# Patient Record
Sex: Male | Born: 1970 | Race: White | Hispanic: No | Smoking: Never smoker
Health system: Southern US, Community
[De-identification: ages and names within clinical notes are randomized; demographics above are authoritative.]

## PROBLEM LIST (undated history)

## (undated) DIAGNOSIS — F32A Depression, unspecified: Secondary | ICD-10-CM

## (undated) DIAGNOSIS — F419 Anxiety disorder, unspecified: Secondary | ICD-10-CM

## (undated) DIAGNOSIS — K589 Irritable bowel syndrome without diarrhea: Secondary | ICD-10-CM

## (undated) DIAGNOSIS — G4730 Sleep apnea, unspecified: Secondary | ICD-10-CM

## (undated) DIAGNOSIS — F329 Major depressive disorder, single episode, unspecified: Secondary | ICD-10-CM

## (undated) HISTORY — DX: Irritable bowel syndrome, unspecified: K58.9

## (undated) HISTORY — DX: Anxiety disorder, unspecified: F41.9

## (undated) HISTORY — DX: Major depressive disorder, single episode, unspecified: F32.9

## (undated) HISTORY — DX: Depression, unspecified: F32.A

---

## 2009-05-17 ENCOUNTER — Emergency Department (HOSPITAL_COMMUNITY): Admission: EM | Admit: 2009-05-17 | Discharge: 2009-05-18 | Payer: Self-pay | Admitting: Emergency Medicine

## 2010-04-18 IMAGING — CT CT ABDOMEN W/O CM
2 of 4 series · 17 of 46 positions shown, 19 images · non-contrast
Comparison: None

CT ABDOMEN

CLINICAL DATA: Right lower quadrant pain, nausea, vomiting,
diarrhea, history year rule bowel syndrome

CT ABDOMEN AND PELVIS WITHOUT CONTRAST
TECHNIQUE: Multidetector CT imaging of the abdomen and pelvis was
performed following the standard protocol without intravenous
contrast. IV contrast not administered due to creatinine of 1.8.
The patient received dilute oral contrast for study. Breast shield
utilized.  Sagittal and coronal MPR images reconstructed from
axial data set.

[Series 2: rtn ap without · axial · non-contrast · 0.64mm/px · z∈[+1202,+1608]mm · 14 of 89 slices shown, 16 images]
[im 4/89  soft-tissue]
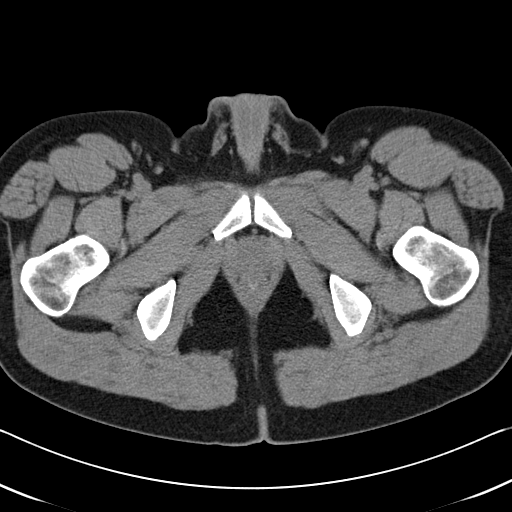
[im 4/89  bone]
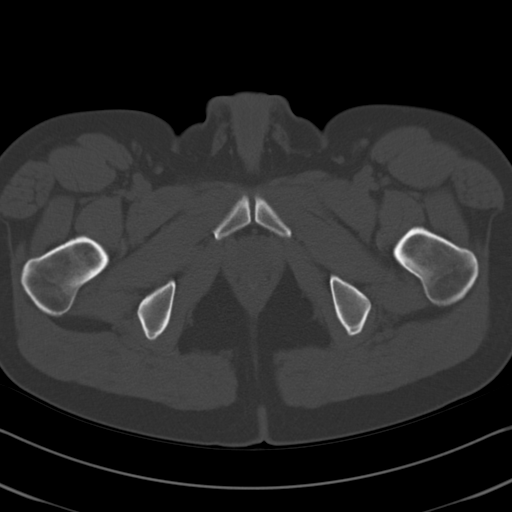
[im 11/89  soft-tissue]
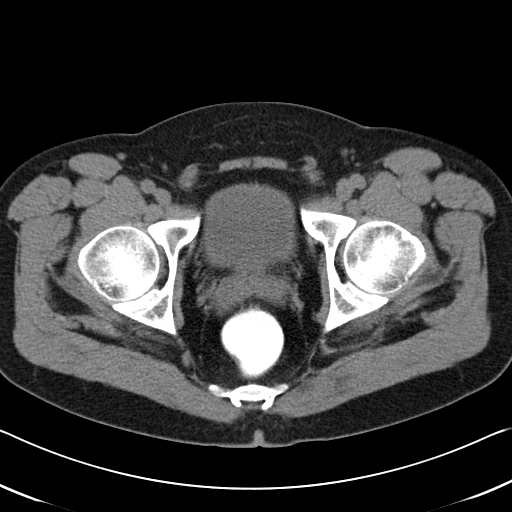
[im 18/89  soft-tissue]
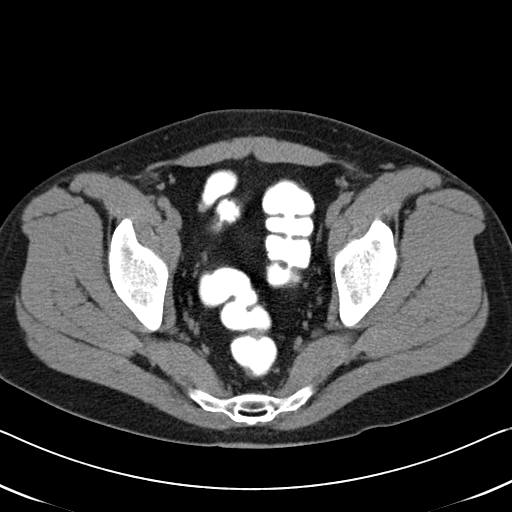
[im 25/89  soft-tissue]
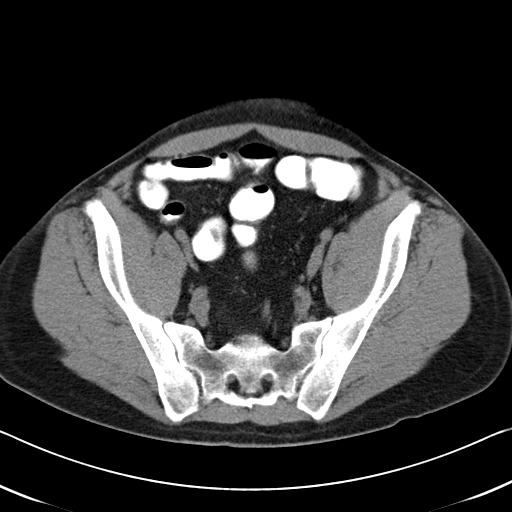
[im 29/89  soft-tissue]
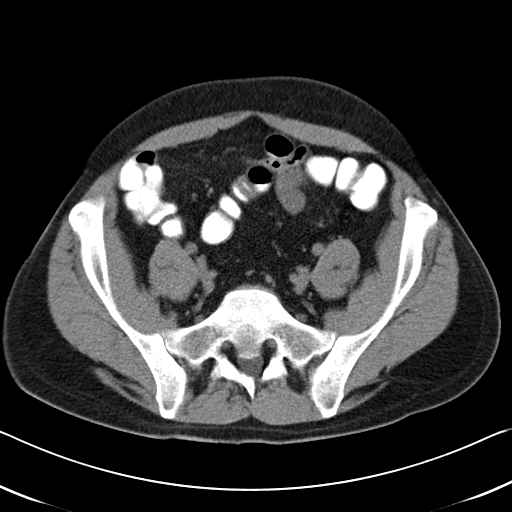
[im 36/89  soft-tissue]
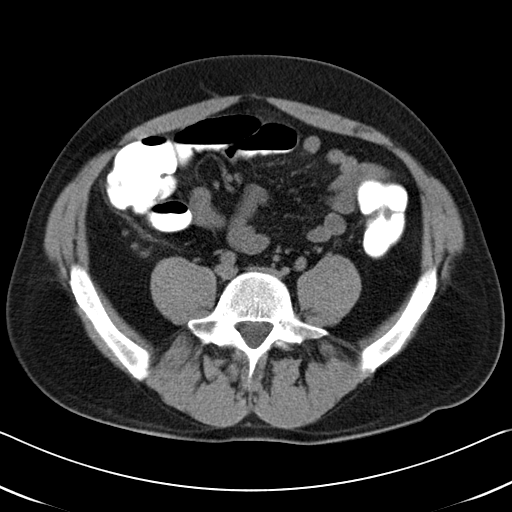
[im 43/89  soft-tissue]
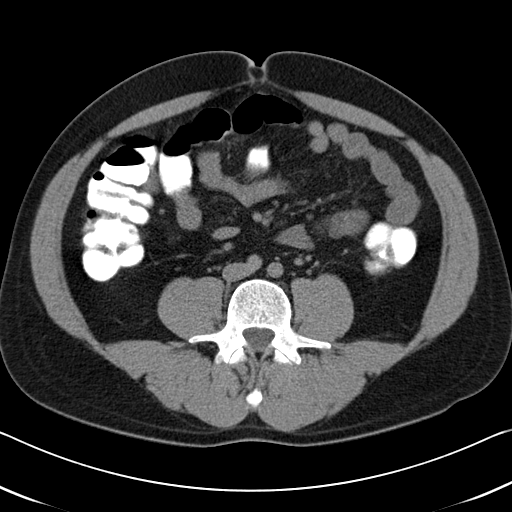
[im 46/89  soft-tissue]
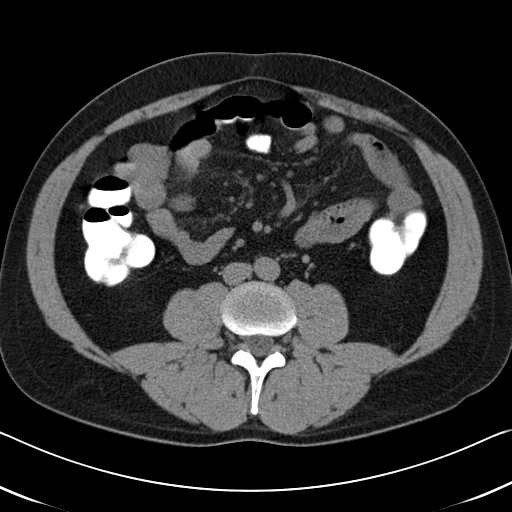
[im 53/89  soft-tissue]
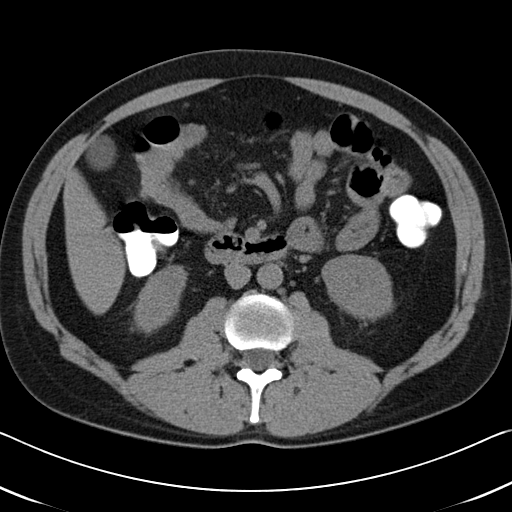
[im 53/89  bone]
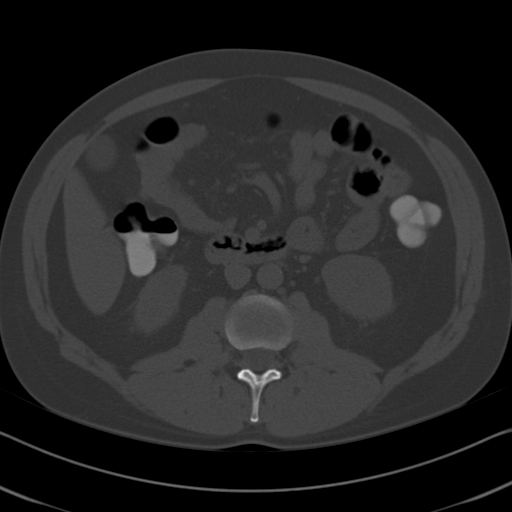
[im 60/89  soft-tissue]
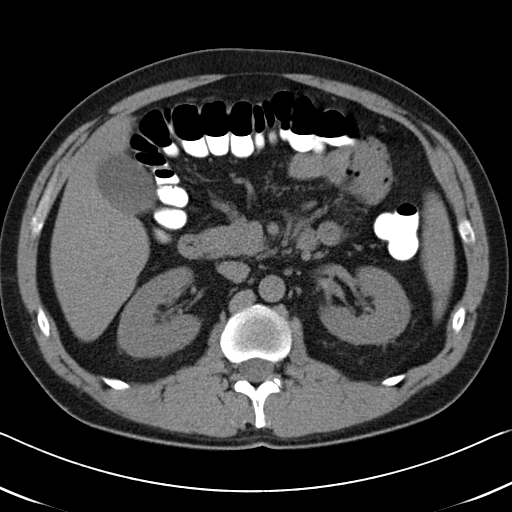
[im 67/89  soft-tissue]
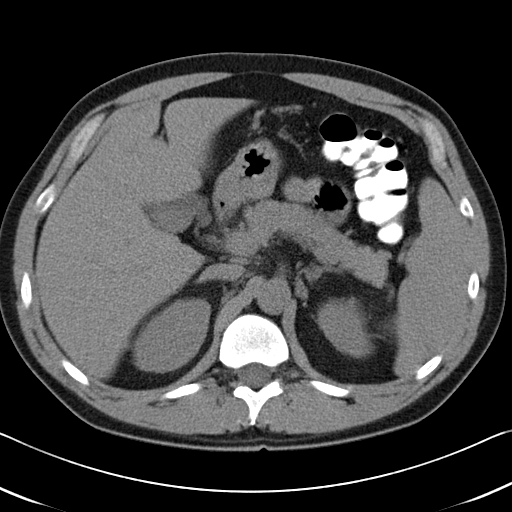
[im 71/89  soft-tissue]
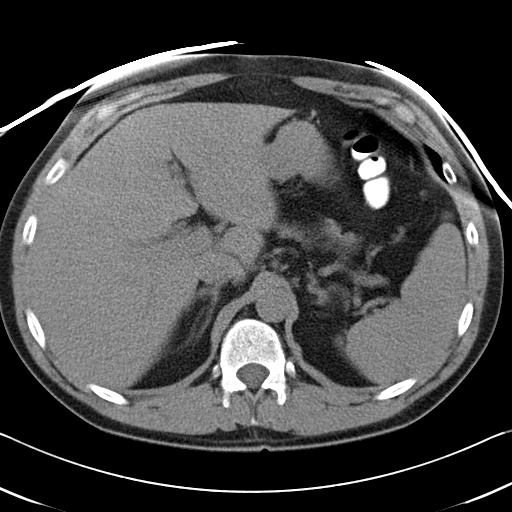
[im 78/89  soft-tissue]
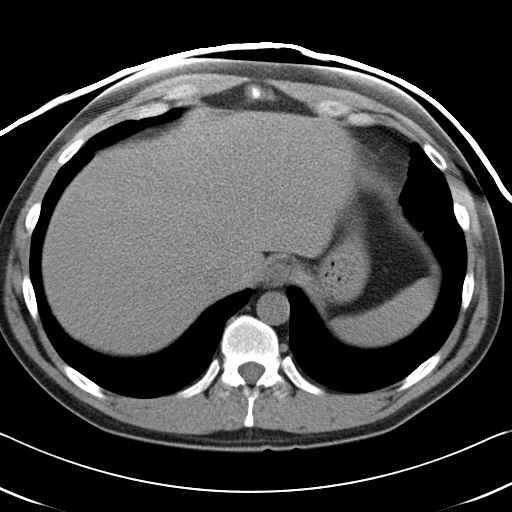
[im 85/89  soft-tissue]
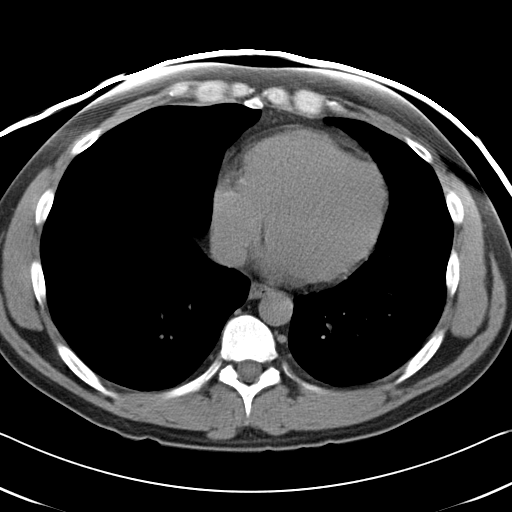

[Series 602: <mpr thick range> · coronal · 0.90mm/px · 3 of 79 slices shown]
[im 27/79  soft-tissue]
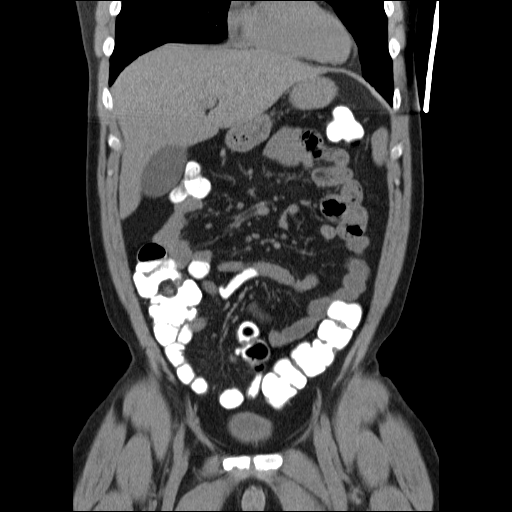
[im 35/79  soft-tissue]
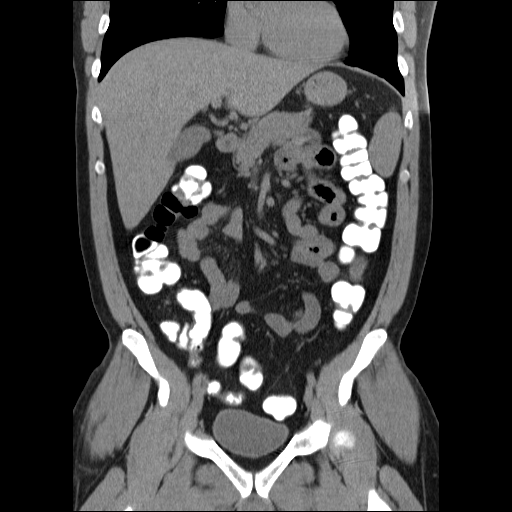
[im 44/79  soft-tissue]
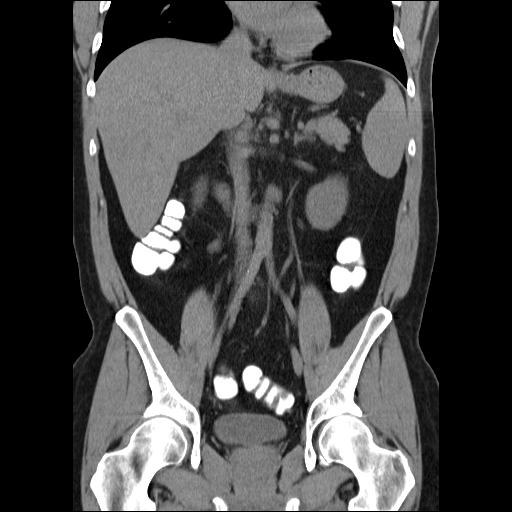

[17 of 46 positions shown; findings below may reference images not displayed]

FINDINGS: Lung bases clear.
Within limits of a nonenhanced exam, no focal abnormalities of the
liver, spleen, pancreas, kidneys, or adrenal glands.
Stomach and upper abdominal bowel loops unremarkable.
No mass, adenopathy, free fluid, or inflammatory process.
IMPRESSION: No acute upper abdominal abnormalities.

CT PELVIS
FINDINGS: Normal appendix.
Large and small bowel loops in pelvis normal.
Contrast present to rectum.
No pelvic mass, adenopathy, free fluid, or inflammatory process.
Bladder unremarkable.
No focal bony abnormalities.
IMPRESSION: No acute intrapelvic abnormalities.

## 2011-04-03 LAB — CBC
HCT: 51.7 % (ref 39.0–52.0)
Platelets: 148 10*3/uL — ABNORMAL LOW (ref 150–400)
RDW: 12.1 % (ref 11.5–15.5)

## 2011-04-03 LAB — URINE MICROSCOPIC-ADD ON

## 2011-04-03 LAB — COMPREHENSIVE METABOLIC PANEL
AST: 100 U/L — ABNORMAL HIGH (ref 0–37)
Albumin: 3.9 g/dL (ref 3.5–5.2)
Alkaline Phosphatase: 63 U/L (ref 39–117)
BUN: 13 mg/dL (ref 6–23)
GFR calc Af Amer: 52 mL/min — ABNORMAL LOW (ref >60)
Potassium: 3.6 mEq/L (ref 3.5–5.1)
Total Protein: 6.6 g/dL (ref 6.0–8.3)

## 2011-04-03 LAB — URINALYSIS, ROUTINE W REFLEX MICROSCOPIC
Glucose, UA: NEGATIVE mg/dL
Hgb urine dipstick: NEGATIVE
Nitrite: NEGATIVE
Specific Gravity, Urine: 1.037 — ABNORMAL HIGH (ref 1.005–1.030)
pH: 5.5 (ref 5.0–8.0)

## 2011-04-03 LAB — DIFFERENTIAL
Basophils Relative: 0 % (ref 0–1)
Lymphocytes Relative: 1 % — ABNORMAL LOW (ref 12–46)
Monocytes Absolute: 0.6 10*3/uL (ref 0.1–1.0)
Monocytes Relative: 6 % (ref 3–12)
Neutro Abs: 9.3 10*3/uL — ABNORMAL HIGH (ref 1.7–7.7)

## 2014-03-19 ENCOUNTER — Encounter: Payer: Self-pay | Admitting: Gastroenterology

## 2014-05-11 ENCOUNTER — Encounter: Payer: Self-pay | Admitting: Gastroenterology

## 2014-05-11 ENCOUNTER — Other Ambulatory Visit: Payer: 59

## 2014-05-11 ENCOUNTER — Ambulatory Visit (INDEPENDENT_AMBULATORY_CARE_PROVIDER_SITE_OTHER): Payer: 59 | Admitting: Gastroenterology

## 2014-05-11 VITALS — BP 100/70 | HR 72 | Ht 69.0 in | Wt 152.0 lb

## 2014-05-11 DIAGNOSIS — R197 Diarrhea, unspecified: Secondary | ICD-10-CM

## 2014-05-11 DIAGNOSIS — K529 Noninfective gastroenteritis and colitis, unspecified: Secondary | ICD-10-CM

## 2014-05-11 NOTE — Patient Instructions (Addendum)
You will have labs checked today in the basement lab.  Please head down after you check out with the front desk  (celiac panel). If the celiac panel is not helpful then further lab tests and likely colonoscopy.

## 2014-05-11 NOTE — Progress Notes (Signed)
HPI: This is a   very pleasant 43 year old man of [place], [place] heritage.  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 He's was told this many years ago.  Loose stools for 20 years.  Imodium will improve his stools.  Still will have urgency.  Has never seen blood.  Has never really had specific testing for this.  Will have to take 4 imodium at night.  Never tested for celiac sprue.  Decaf coffee daily  Drinks 0-2 beers per day.   Review of systems: Pertinent positive and negative review of systems were noted in the above HPI section. Complete review of systems was performed and was otherwise normal.    Past Medical History  Diagnosis Date  . Anxiety   . IBS (irritable bowel syndrome)   . Depression     History reviewed. No pertinent past surgical history.  No current outpatient prescriptions on file.   No current facility-administered medications for this visit.    Allergies as of 05/11/2014 - Review Complete 05/11/2014  Allergen Reaction Noted  . Codeine  05/11/2014    Family History  Problem Relation Age of Onset  . Diabetes Mother     uncle  . Heart disease Mother     father  . Stomach cancer Mother     History   Social History  . Marital Status: Married    Spouse Name: N/A    Number of Children: N/A  . Years of Education: N/A   Occupational History  . Not on file.   Social History Main Topics  . Smoking status: Former Games developermoker  . Smokeless tobacco: Not on file  . Alcohol Use: Yes     Comment: 0-3 per day  . Drug Use: No  . Sexual Activity: Not on file   Other Topics Concern  . Not on file   Social History Narrative  . No narrative on file       Physical Exam: Ht 5\' 9"  (1.753 m)  Wt 152 lb (68.947 kg)  BMI 22.44 kg/m2 Constitutional: generally well-appearing Psychiatric: alert and oriented x3 Eyes: extraocular movements intact Mouth: oral pharynx moist, no lesions Neck: supple no  lymphadenopathy Cardiovascular: heart regular rate and rhythm Lungs: clear to auscultation bilaterally Abdomen: soft, nontender, nondistended, no obvious ascites, no peritoneal signs, normal bowel sounds Extremities: no lower extremity edema bilaterally Skin: no lesions on visible extremities    Assessment and plan: 43 y.o. male with chronic diarrhea, [place], [place] heritage  I think there is a very good chance that he has a celiac sprue. He will have a celiac sprue panel performed today and if it is positive I will discuss with him having an upper endoscopy to obtain biopsy proof. If the celiac panel is negative then he would likely require further lab tests and a colonoscopy to rule out other potential causes of his chronic diarrhea.

## 2014-05-12 LAB — CELIAC PANEL 10
Endomysial Screen: NEGATIVE
GLIADIN IGG: 15.2 U/mL (ref <20)
Gliadin IgA: 9.2 U/mL (ref <20)
IGA: 247 mg/dL (ref 68–379)
Tissue Transglut Ab: 5.5 U/mL (ref <20)
Tissue Transglutaminase Ab, IgA: 3.3 U/mL (ref <20)

## 2014-05-14 ENCOUNTER — Telehealth: Payer: Self-pay | Admitting: Gastroenterology

## 2014-05-14 NOTE — Telephone Encounter (Signed)
Pt aware that the labs have not been reviewed I will call as soon as available

## 2014-05-19 ENCOUNTER — Encounter: Payer: Self-pay | Admitting: Gastroenterology

## 2014-05-19 ENCOUNTER — Other Ambulatory Visit: Payer: Self-pay

## 2014-05-19 DIAGNOSIS — R197 Diarrhea, unspecified: Secondary | ICD-10-CM

## 2014-09-30 ENCOUNTER — Encounter: Payer: Self-pay | Admitting: Gastroenterology

## 2014-10-18 ENCOUNTER — Other Ambulatory Visit: Payer: 59

## 2014-10-18 DIAGNOSIS — R197 Diarrhea, unspecified: Secondary | ICD-10-CM

## 2014-10-19 LAB — GASTROINTESTINAL PATHOGEN PANEL PCR
C. DIFFICILE TOX A/B, PCR: NEGATIVE
CAMPYLOBACTER, PCR: NEGATIVE
Cryptosporidium, PCR: NEGATIVE
E COLI (ETEC) LT/ST, PCR: NEGATIVE
E COLI (STEC) STX1/STX2, PCR: NEGATIVE
E COLI 0157, PCR: NEGATIVE
Giardia lamblia, PCR: NEGATIVE
Norovirus, PCR: NEGATIVE
Rotavirus A, PCR: NEGATIVE
SALMONELLA, PCR: NEGATIVE
Shigella, PCR: NEGATIVE

## 2014-11-12 ENCOUNTER — Ambulatory Visit (AMBULATORY_SURGERY_CENTER): Payer: Self-pay | Admitting: *Deleted

## 2014-11-12 VITALS — Ht 69.0 in | Wt 156.0 lb

## 2014-11-12 DIAGNOSIS — R197 Diarrhea, unspecified: Secondary | ICD-10-CM

## 2014-11-12 MED ORDER — MOVIPREP 100 G PO SOLR: 1.0000 | Freq: Once | ORAL | Status: DC

## 2014-11-12 NOTE — Progress Notes (Signed)
No egg or soy allergy. No anesthesia problems.  No diet meds.  No Home O2.

## 2014-12-03 ENCOUNTER — Ambulatory Visit (AMBULATORY_SURGERY_CENTER): Payer: 59 | Admitting: Gastroenterology

## 2014-12-03 ENCOUNTER — Encounter: Payer: Self-pay | Admitting: Gastroenterology

## 2014-12-03 VITALS — BP 122/62 | HR 82 | Temp 98.9°F | Resp 24 | Ht 69.0 in | Wt 156.0 lb

## 2014-12-03 DIAGNOSIS — R197 Diarrhea, unspecified: Secondary | ICD-10-CM

## 2014-12-03 MED ORDER — SODIUM CHLORIDE 0.9 % IV SOLN
500.0000 mL | INTRAVENOUS | Status: DC
Start: 2014-12-03 — End: 2014-12-03

## 2014-12-03 NOTE — Patient Instructions (Signed)
YOU HAD AN ENDOSCOPIC PROCEDURE TODAY AT THE [facility]: Refer to the procedure report that was given to you for any specific questions about what was found during the examination.  If the procedure report does not answer your questions, please call your gastroenterologist to clarify.  If you requested that your care partner not be given the details of your procedure findings, then the procedure report has been included in a sealed envelope for you to review at your convenience later.  YOU SHOULD EXPECT: Some feelings of bloating in the abdomen. Passage of more gas than usual.  Walking can help get rid of the air that was put into your GI tract during the procedure and reduce the bloating. If you had a lower endoscopy (such as a colonoscopy or flexible sigmoidoscopy) you may notice spotting of blood in your stool or on the toilet paper. If you underwent a bowel prep for your procedure, then you may not have a normal bowel movement for a few days.  DIET: Your first meal following the procedure should be a light meal and then it is ok to progress to your normal diet.  A half-sandwich or bowl of soup is an example of a good first meal.  Heavy or fried foods are harder to digest and may make you feel nauseous or bloated.  Likewise meals heavy in dairy and vegetables can cause extra gas to form and this can also increase the bloating.  Drink plenty of fluids but you should avoid alcoholic beverages for 24 hours.  ACTIVITY: Your care partner should take you home directly after the procedure.  You should plan to take it easy, moving slowly for the rest of the day.  You can resume normal activity the day after the procedure however you should NOT DRIVE or use heavy machinery for 24 hours (because of the sedation medicines used during the test).    SYMPTOMS TO REPORT IMMEDIATELY: A gastroenterologist can be reached at any hour.  During normal business hours, 8:30 AM to 5:00 PM Monday through Friday,  call [phone].  After hours and on weekends, please call the GI answering service at [phone] who will take a message and have the physician on call contact you.   Following lower endoscopy (colonoscopy or flexible sigmoidoscopy):  Excessive amounts of blood in the stool  Significant tenderness or worsening of abdominal pains  Swelling of the abdomen that is new, acute  Fever of 100F or higher    FOLLOW UP: If any biopsies were taken you will be contacted by phone or by letter within the next 1-3 weeks.  Call your gastroenterologist if you have not heard about the biopsies in 3 weeks.  Our staff will call the home number listed on your records the next business day following your procedure to check on you and address any questions or concerns that you may have at that time regarding the information given to you following your procedure. This is a courtesy call and so if there is no answer at the home number and we have not heard from you through the emergency physician on call, we will assume that you have returned to your regular daily activities without incident.  SIGNATURES/CONFIDENTIALITY: You and/or your care partner have signed paperwork which will be entered into your electronic medical record.  These signatures attest to the fact that that the information above on your After Visit Summary has been reviewed and is understood.  Full responsibility of the confidentiality   of this discharge information lies with you and/or your care-partner.   Information on diverticulosis given to you today   

## 2014-12-03 NOTE — Progress Notes (Signed)
Report to PACU, RN, vss, BBS= Clear.  

## 2014-12-03 NOTE — Progress Notes (Signed)
Called to room to assist during endoscopic procedure.  Patient ID and intended procedure confirmed with present staff. Received instructions for my participation in the procedure from the performing physician.  

## 2014-12-03 NOTE — Op Note (Signed)
[facility] [address] [place], [id number]   COLONOSCOPY PROCEDURE REPORT  PATIENT: Gregory Vaughan, Gregory Vaughan  MR#: [id number] BIRTHDATE: February 03, 1971 , 43  yrs. old GENDER: male ENDOSCOPIST: Rachael Feeaniel P Jacobs, MD PROCEDURE DATE:  12/03/2014 PROCEDURE:   Colonoscopy with biopsy First Screening Colonoscopy - Avg.  risk and is 50 yrs.  old or older - No.  Prior Negative Screening - Now for repeat screening. N/A  History of Adenoma - Now for follow-up colonoscopy & has been > or = to 3 yrs.  N/A  Polyps Removed Today? No.  Recommend repeat exam, <10 yrs? No. ASA CLASS:   Class II INDICATIONS:chronic loose stools.  GI pathogen panel neg, celiac panel neg. MEDICATIONS: Monitored anesthesia care and Propofol 260 mg IV  DESCRIPTION OF PROCEDURE:   After the risks benefits and alternatives of the procedure were thoroughly explained, informed consent was obtained.  The digital rectal exam revealed no abnormalities of the rectum.   The LB PFC-[id number]  endoscope was introduced through the anus and advanced to the terminal ileum which was intubated for a short distance. No adverse events experienced.   The quality of the prep was excellent.  The instrument was then slowly withdrawn as the colon was fully examined.   COLON FINDINGS: The terminal ileum was normal.  There were a few scattered left sided diverticulum.  The colonic mucosa was normal, biopsied and sent to pathology.  Retroflexed views revealed no abnormalities. The time to cecum=3 minutes 51 seconds.  Withdrawal time=6 minutes 32 seconds.  The scope was withdrawn and the procedure completed. COMPLICATIONS: There were no immediate complications.  ENDOSCOPIC IMPRESSION: The terminal ileum was normal.  There were a few scattered left sided diverticulum.  The colonic mucosa was normal, biopsied and sent to pathology  RECOMMENDATIONS: Await biopsy results For now, continue imodium as needed.  eSigned:  Rachael Feeaniel P  Jacobs, MD 12/03/2014 9:23 AM

## 2014-12-06 ENCOUNTER — Telehealth: Payer: Self-pay | Admitting: *Deleted

## 2014-12-06 NOTE — Telephone Encounter (Signed)
  Follow up Call-  Call back number 12/03/2014  Post procedure Call Back phone  # [phone]  Permission to leave phone message Yes     Patient questions:  Do you have a fever, pain , or abdominal swelling? Yes.   Pain Score  0 *  Have you tolerated food without any problems? Yes.    Have you been able to return to your normal activities? Yes.    Do you have any questions about your discharge instructions: Diet   No. Medications  No. Follow up visit  No.  Do you have questions or concerns about your Care? No.  Actions: * If pain score is 4 or above: No action needed, pain <4. Patient stating he has a 100.2 temp. This am and some chest congestion. Patient stating his preschooler is also sick. Denies and abdominal pain, swelling or bleeding. Also no complaints of nausea, vomiting or diarrhea.

## 2015-12-01 ENCOUNTER — Ambulatory Visit (INDEPENDENT_AMBULATORY_CARE_PROVIDER_SITE_OTHER): Payer: 59 | Admitting: Internal Medicine

## 2015-12-01 ENCOUNTER — Encounter: Payer: Self-pay | Admitting: Internal Medicine

## 2015-12-01 VITALS — BP 120/80 | HR 56 | Temp 98.4°F | Resp 16 | Ht 69.0 in | Wt 156.4 lb

## 2015-12-01 DIAGNOSIS — K529 Noninfective gastroenteritis and colitis, unspecified: Secondary | ICD-10-CM | POA: Diagnosis not present

## 2015-12-01 DIAGNOSIS — Z23 Encounter for immunization: Secondary | ICD-10-CM

## 2015-12-01 DIAGNOSIS — F411 Generalized anxiety disorder: Secondary | ICD-10-CM | POA: Diagnosis not present

## 2015-12-01 MED ORDER — DULOXETINE HCL 30 MG PO CPEP: 30.0000 mg | ORAL_CAPSULE | Freq: Every day | ORAL | Status: DC

## 2015-12-01 MED ORDER — ELUXADOLINE 100 MG PO TABS: 100.0000 mg | ORAL_TABLET | Freq: Two times a day (BID) | ORAL | Status: DC

## 2015-12-01 NOTE — Patient Instructions (Signed)
We have sent in the medicine for the anxiety called duloxetine (cymbalta) that you take once a day. Give it 2-3 weeks to get to full benefit. If it is helping some but not enough we can increase you to the full dose (from the starting dose).   We have given you the medicine called viberzi for the diarrhea. It is a twice daily medicine that can take up to 2 weeks to get to full benefit. If you are not helped in 2 weeks then it may not be right for you.   Stress and Stress Management Stress is a normal reaction to life events. It is what you feel when life demands more than you are used to or more than you can handle. Some stress can be useful. For example, the stress reaction can help you catch the last bus of the day, study for a test, or meet a deadline at work. But stress that occurs too often or for too long can cause problems. It can affect your emotional health and interfere with relationships and normal daily activities. Too much stress can weaken your immune system and increase your risk for physical illness. If you already have a medical problem, stress can make it worse. CAUSES  All sorts of life events may cause stress. An event that causes stress for one person may not be stressful for another person. Major life events commonly cause stress. These may be positive or negative. Examples include losing your job, moving into a new home, getting married, having a baby, or losing a loved one. Less obvious life events may also cause stress, especially if they occur day after day or in combination. Examples include working long hours, driving in traffic, caring for children, being in debt, or being in a difficult relationship. SIGNS AND SYMPTOMS Stress may cause emotional symptoms including, the following:  Anxiety. This is feeling worried, afraid, on edge, overwhelmed, or out of control.  Anger. This is feeling irritated or impatient.  Depression. This is feeling sad, down, helpless, or  guilty.  Difficulty focusing, remembering, or making decisions. Stress may cause physical symptoms, including the following:   Aches and pains. These may affect your head, neck, back, stomach, or other areas of your body.  Tight muscles or clenched jaw.  Low energy or trouble sleeping. Stress may cause unhealthy behaviors, including the following:   Eating to feel better (overeating) or skipping meals.  Sleeping too little, too much, or both.  Working too much or putting off tasks (procrastination).  Smoking, drinking alcohol, or using drugs to feel better. DIAGNOSIS  Stress is diagnosed through an assessment by your health care provider. Your health care provider will ask questions about your symptoms and any stressful life events.Your health care provider will also ask about your medical history and may order blood tests or other tests. Certain medical conditions and medicine can cause physical symptoms similar to stress. Mental illness can cause emotional symptoms and unhealthy behaviors similar to stress. Your health care provider may refer you to a mental health professional for further evaluation.  TREATMENT  Stress management is the recommended treatment for stress.The goals of stress management are reducing stressful life events and coping with stress in healthy ways.  Techniques for reducing stressful life events include the following:  Stress identification. Self-monitor for stress and identify what causes stress for you. These skills may help you to avoid some stressful events.  Time management. Set your priorities, keep a calendar of events, and learn  to say "no." These tools can help you avoid making too many commitments. Techniques for coping with stress include the following:  Rethinking the problem. Try to think realistically about stressful events rather than ignoring them or overreacting. Try to find the positives in a stressful situation rather than focusing on  the negatives.  Exercise. Physical exercise can release both physical and emotional tension. The key is to find a form of exercise you enjoy and do it regularly.  Relaxation techniques. These relax the body and mind. Examples include yoga, meditation, tai chi, biofeedback, deep breathing, progressive muscle relaxation, listening to music, being out in nature, journaling, and other hobbies. Again, the key is to find one or more that you enjoy and can do regularly.  Healthy lifestyle. Eat a balanced diet, get plenty of sleep, and do not smoke. Avoid using alcohol or drugs to relax.  Strong support network. Spend time with family, friends, or other people you enjoy being around.Express your feelings and talk things over with someone you trust. Counseling or talktherapy with a mental health professional may be helpful if you are having difficulty managing stress on your own. Medicine is typically not recommended for the treatment of stress.Talk to your health care provider if you think you need medicine for symptoms of stress. HOME CARE INSTRUCTIONS  Keep all follow-up visits as directed by your health care provider.  Take all medicines as directed by your health care provider. SEEK MEDICAL CARE IF:  Your symptoms get worse or you start having new symptoms.  You feel overwhelmed by your problems and can no longer manage them on your own. SEEK IMMEDIATE MEDICAL CARE IF:  You feel like hurting yourself or someone else.   This information is not intended to replace advice given to you by your health care provider. Make sure you discuss any questions you have with your health care provider.   Document Released: 06/05/2001 Document Revised: 12/31/2014 Document Reviewed: 08/04/2013 Elsevier Interactive Patient Education [employer].

## 2015-12-01 NOTE — Progress Notes (Signed)
Pre visit review using our clinic review tool, if applicable. No additional management support is needed unless otherwise documented below in the visit note. 

## 2015-12-02 ENCOUNTER — Encounter: Payer: Self-pay | Admitting: Internal Medicine

## 2015-12-02 DIAGNOSIS — F411 Generalized anxiety disorder: Secondary | ICD-10-CM | POA: Insufficient documentation

## 2015-12-02 NOTE — Assessment & Plan Note (Signed)
Some of this anxiety stems from his IBS symptoms. Will try cymbalta as he needs slightly more energy and focus and wishes a medicine with low risk for sexual dysfunction.

## 2015-12-02 NOTE — Assessment & Plan Note (Signed)
Has tried several things in the past and rx for viberzi and savings card given to patient today to try. This would alleviate his need for imodium daily.

## 2015-12-02 NOTE — Progress Notes (Signed)
   Subjective:    Patient ID: Gregory Vaughan, male    DOB: Mar 31, 1971, 44 y.o.   MRN: [id number]  HPI The patient is a 44 YO man coming in new with several problems. He has had IBS for many years and has diarrhea sometimes. He does take 4 imodium daily and that helps most of the time. He gets some mild cramping with the diarrhea. This causes him some anxiety thinking about what might happen if he is not able to have a bathroom. This does not often cause him to change his schedule. He has not been able to isolate foods that worsen his IBS except ruffage.  His next concern is anxiety, he has been struggling with it for years. He has been in counseling on and off which helps but is expensive and time consuming. He does exercise several times per week and has a good support system.   [facility], [facility], social history reviewed and updated.   Review of Systems  Constitutional: Negative for fever, activity change, appetite change, fatigue and unexpected weight change.  HENT: Negative.   Eyes: Negative.   Respiratory: Negative for cough, chest tightness, shortness of breath and wheezing.   Cardiovascular: Negative for chest pain, palpitations and leg swelling.  Gastrointestinal: Positive for diarrhea. Negative for nausea, abdominal pain, constipation and abdominal distention.  Musculoskeletal: Negative.   Skin: Negative.   Neurological: Negative.   Psychiatric/Behavioral: Positive for dysphoric mood and decreased concentration. Negative for hallucinations, behavioral problems and sleep disturbance. The patient is nervous/anxious. The patient is not hyperactive.       Objective:   Physical Exam  Constitutional: He is oriented to person, place, and time. He appears well-developed and well-nourished.  HENT:  Head: Normocephalic and atraumatic.  Eyes: EOM are normal.  Neck: Normal range of motion.  Cardiovascular: Normal rate and regular rhythm.   Pulmonary/Chest: Effort normal and breath sounds normal. No  respiratory distress. He has no wheezes. He has no rales.  Abdominal: Soft. Bowel sounds are normal. He exhibits no distension. There is no tenderness. There is no rebound.  Musculoskeletal: He exhibits no edema.  Neurological: He is alert and oriented to person, place, and time.  Skin: Skin is warm and dry.  Psychiatric: He has a normal mood and affect.   Filed Vitals:   12/01/15 0928  BP: 120/80  Pulse: 56  Temp: 98.4 F (36.9 C)  TempSrc: Oral  Resp: 16  Height: 5\' 9"  (1.753 m)  Weight: 156 lb 6.4 oz (70.943 kg)  SpO2: 98%      Assessment & Plan:  Flu shot given at visit.

## 2016-04-02 ENCOUNTER — Telehealth: Payer: Self-pay | Admitting: Internal Medicine

## 2016-04-02 ENCOUNTER — Other Ambulatory Visit: Payer: Self-pay | Admitting: Geriatric Medicine

## 2016-04-02 MED ORDER — DULOXETINE HCL 30 MG PO CPEP: 30.0000 mg | ORAL_CAPSULE | Freq: Every day | ORAL | Status: DC

## 2016-04-02 NOTE — Telephone Encounter (Signed)
Sent to pharmacy 

## 2016-04-02 NOTE — Telephone Encounter (Signed)
Pt requesting refill for DULoxetine (CYMBALTA) 30 MG capsule [[id number] Pharmacy [facility]

## 2016-05-31 ENCOUNTER — Encounter: Payer: Self-pay | Admitting: Internal Medicine

## 2016-05-31 ENCOUNTER — Ambulatory Visit (INDEPENDENT_AMBULATORY_CARE_PROVIDER_SITE_OTHER): Payer: 59 | Admitting: Internal Medicine

## 2016-05-31 VITALS — BP 132/86 | HR 69 | Temp 98.6°F | Resp 16 | Ht 69.0 in | Wt 161.0 lb

## 2016-05-31 DIAGNOSIS — Z9989 Dependence on other enabling machines and devices: Secondary | ICD-10-CM

## 2016-05-31 DIAGNOSIS — F411 Generalized anxiety disorder: Secondary | ICD-10-CM | POA: Diagnosis not present

## 2016-05-31 DIAGNOSIS — G4733 Obstructive sleep apnea (adult) (pediatric): Secondary | ICD-10-CM | POA: Insufficient documentation

## 2016-05-31 DIAGNOSIS — R0683 Snoring: Secondary | ICD-10-CM | POA: Diagnosis not present

## 2016-05-31 DIAGNOSIS — K529 Noninfective gastroenteritis and colitis, unspecified: Secondary | ICD-10-CM | POA: Diagnosis not present

## 2016-05-31 NOTE — Assessment & Plan Note (Signed)
Ordered home sleep test

## 2016-05-31 NOTE — Assessment & Plan Note (Signed)
Doing well on cymbalta and no side effects, no residual symptoms.

## 2016-05-31 NOTE — Assessment & Plan Note (Signed)
Using imodium with reasonable success. He is not having as much anxiety about this. Viberzi did not work for him.

## 2016-05-31 NOTE — Patient Instructions (Signed)
We will get the sleep test done at home so you will get a call about scheduling that. It does take 1-2 weeks after having the test to get results.   No changes to the medicine today.   Come back in about 6-12 months for a physical and please feel free to call us sooner if you have any problems or send us a mychart message.

## 2016-05-31 NOTE — Progress Notes (Signed)
Pre visit review using our clinic review tool, if applicable. No additional management support is needed unless otherwise documented below in the visit note. 

## 2016-05-31 NOTE — Progress Notes (Signed)
   Subjective:    Patient ID: Gregory Vaughan, male    DOB: 1971/07/22, 45 y.o.   MRN: [id number]  HPI The patient is a 45 YO man coming in for follow up of his IBS (we had tried viberzi at our last visit, he is not taking now, did not help much, taking imodium with good success), and his anxiety (started on cymbalta at last visit, this is doing great). His wife does notice that he is snoring and she wants him checked for sleep apnea. His father does have pauses in his sleep and she has noticed a couple with him. Snoring has worsened in the last 2-3 years.   Review of Systems  Constitutional: Negative for fever, activity change, appetite change, fatigue and unexpected weight change.  HENT: Negative.   Eyes: Negative.   Respiratory: Negative for cough, chest tightness, shortness of breath and wheezing.   Cardiovascular: Negative for chest pain, palpitations and leg swelling.  Gastrointestinal: Positive for diarrhea. Negative for nausea, abdominal pain, constipation and abdominal distention.  Musculoskeletal: Negative.   Skin: Negative.   Neurological: Negative.   Psychiatric/Behavioral: Negative for hallucinations, behavioral problems, sleep disturbance, dysphoric mood and decreased concentration. The patient is not nervous/anxious and is not hyperactive.       Objective:   Physical Exam  Constitutional: He is oriented to person, place, and time. He appears well-developed and well-nourished.  HENT:  Head: Normocephalic and atraumatic.  Eyes: EOM are normal.  Neck: Normal range of motion.  Cardiovascular: Normal rate and regular rhythm.   Pulmonary/Chest: Effort normal and breath sounds normal. No respiratory distress. He has no wheezes.  Abdominal: Soft. He exhibits no distension. There is no tenderness.  Musculoskeletal: He exhibits no edema.  Neurological: He is alert and oriented to person, place, and time.  Skin: Skin is warm and dry.  Psychiatric: He has a normal mood and affect.     Filed Vitals:   05/31/16 0825  BP: 132/86  Pulse: 69  Temp: 98.6 F (37 C)  TempSrc: Oral  Resp: 16  Height: 5\' 9"  (1.753 m)  Weight: 161 lb (73.029 kg)  SpO2: 96%      Assessment & Plan:

## 2016-06-27 ENCOUNTER — Telehealth: Payer: Self-pay

## 2016-06-27 NOTE — Telephone Encounter (Signed)
Patient called about the sleep study that he said Dr. Okey Duprerawford was going to order. He has not heard anything from anyone about this. I do see a order in about it, Where does this need to go from here? Can you help?

## 2016-06-28 NOTE — Telephone Encounter (Signed)
I have called and informed patient that everything has been sent and he [facility] be hearing [facility] soon.

## 2016-06-28 NOTE — Telephone Encounter (Signed)
[facility] will contact the patient

## 2016-07-27 ENCOUNTER — Telehealth: Payer: Self-pay | Admitting: Emergency Medicine

## 2016-07-27 NOTE — Telephone Encounter (Signed)
Pt called about results to his sleep study. Please advise thanks.

## 2016-07-31 ENCOUNTER — Other Ambulatory Visit: Payer: Self-pay | Admitting: Internal Medicine

## 2016-07-31 DIAGNOSIS — G4733 Obstructive sleep apnea (adult) (pediatric): Secondary | ICD-10-CM

## 2016-07-31 NOTE — Telephone Encounter (Signed)
I have not seen the results of his sleep study. Can we try to get from [facility]?

## 2016-07-31 NOTE — Progress Notes (Signed)
Please call and let him know that he did have moderate OSA and we have written a prescription for CPAP machine for him to use for his symptoms. He can pick up the prescription or we can fax to [facility] if he wants to use them.

## 2016-07-31 NOTE — Progress Notes (Signed)
Patient will come in and pick up prescription. Placed at cabinet up front.

## 2016-08-03 ENCOUNTER — Other Ambulatory Visit: Payer: Self-pay | Admitting: *Deleted

## 2016-08-03 MED ORDER — DULOXETINE HCL 30 MG PO CPEP: 30.0000 mg | ORAL_CAPSULE | Freq: Every day | ORAL | 5 refills | Status: DC

## 2016-08-06 ENCOUNTER — Telehealth: Payer: Self-pay | Admitting: Emergency Medicine

## 2016-08-06 NOTE — Telephone Encounter (Signed)
Need an Rx for CPAP with pressure setting and CPAP supplies and a copy of the sleep study faxed to [phone]. They said they got the Rx for the CPAP machine, but do not have the settings.

## 2016-08-06 NOTE — Telephone Encounter (Signed)
I placed all the settings in the computer when I placed the order. Can they not open up the order and see them? I can see them in the other orders tab with the following information:  Patient has OSA or probable OSA Yes   Is the patient currently using CPAP in the home No   If no (to question two) date of sleep study 07/16/16   Date of face to face encounter 05/31/2016   Settings Autotitration   Signs and symptoms of probable OSA (select all that apply) Moring headaches    Snoring   CPAP supplies needed Mask, headgear, cushions, filters, heated tubing and water chamber

## 2016-08-06 NOTE — Telephone Encounter (Signed)
[facility] called about a referral for a CPAP machine for the pt. Please follow up thanks.

## 2016-08-06 NOTE — Telephone Encounter (Signed)
Faxed information.

## 2016-08-08 ENCOUNTER — Encounter: Payer: Self-pay | Admitting: Internal Medicine

## 2016-08-10 NOTE — Telephone Encounter (Signed)
Called [facility] [phone] request copy of result to be fax over (havent got the fax yet), call back multiple times to request copy to be refax, will continue to call and get this copy to our office.   Once have the copy of the study, we need to call Thereasa DistanceRodney at [facility] [phone] to get CPAP machine for pt.

## 2016-08-13 NOTE — Telephone Encounter (Signed)
Received test result from [facility]. Left massage for Thereasa DistanceRodney to return call.

## 2016-08-14 ENCOUNTER — Telehealth: Payer: Self-pay | Admitting: Emergency Medicine

## 2016-08-14 NOTE — Telephone Encounter (Signed)
Has been faxed again with correct settings.

## 2016-08-14 NOTE — Telephone Encounter (Signed)
Paperwork faxed to [facility]. Never got a return call from Thereasa DistanceRodney, will try again today to let him know that we faxed paperwork.

## 2016-08-14 NOTE — Telephone Encounter (Signed)
Gregory Vaughan is calling back. States that he got the orders, but the instructions do not make sense. He states that they were written out, and he has no clue what we are trying to do. Please contact him

## 2016-08-14 NOTE — Telephone Encounter (Signed)
5 to 20 mmgh. Called and spoke to [place] and refaxed orders.

## 2016-08-14 NOTE — Telephone Encounter (Signed)
[facility] called and stated they need the pressure setting to be on the prescription for the CPAP machine. They stated it wasn't on the fax from yesterday. Please advise thanks.

## 2016-08-15 ENCOUNTER — Encounter: Payer: Self-pay | Admitting: Internal Medicine

## 2016-09-25 ENCOUNTER — Ambulatory Visit (INDEPENDENT_AMBULATORY_CARE_PROVIDER_SITE_OTHER): Payer: 59 | Admitting: Internal Medicine

## 2016-09-25 ENCOUNTER — Encounter: Payer: Self-pay | Admitting: Internal Medicine

## 2016-09-25 DIAGNOSIS — Z23 Encounter for immunization: Secondary | ICD-10-CM | POA: Diagnosis not present

## 2016-09-25 DIAGNOSIS — Z9989 Dependence on other enabling machines and devices: Secondary | ICD-10-CM

## 2016-09-25 DIAGNOSIS — G4733 Obstructive sleep apnea (adult) (pediatric): Secondary | ICD-10-CM | POA: Diagnosis not present

## 2016-09-25 NOTE — Progress Notes (Signed)
   Subjective:    Patient ID: Gregory Vaughan, male    DOB: 01/05/1971, 45 y.o.   MRN: [id number]  HPI The patient is a 45 YO man coming in for follow up of starting his CPAP for OSA. He is compliant with wearing it nightly and >6 hours. He is noticing more energy in the morning and resolution of his morning headaches. No complaints. No allergy drainage. Using nasal pillow and fine with that.   Review of Systems  Constitutional: Negative for activity change, appetite change, fatigue, fever and unexpected weight change.  HENT: Negative.   Eyes: Negative.   Respiratory: Negative for apnea, cough, chest tightness, shortness of breath and wheezing.   Cardiovascular: Negative for chest pain, palpitations and leg swelling.      Objective:   Physical Exam  Constitutional: He is oriented to person, place, and time. He appears well-developed and well-nourished.  HENT:  Head: Normocephalic and atraumatic.  Nose: Nose normal.  Mouth/Throat: Oropharynx is clear and moist.  Eyes: EOM are normal.  Neck: Normal range of motion. No JVD present.  Cardiovascular: Normal rate and regular rhythm.   Pulmonary/Chest: Effort normal and breath sounds normal. No respiratory distress. He has no wheezes. He has no rales. He exhibits no tenderness.  Abdominal: Soft. He exhibits no distension. There is no tenderness.  Lymphadenopathy:    He has no cervical adenopathy.  Neurological: He is alert and oriented to person, place, and time.  Skin: Skin is warm and dry.   Vitals:   09/25/16 1529  BP: 138/78  Pulse: 70  Resp: 16  Temp: 98.6 F (37 C)  TempSrc: Oral  SpO2: 98%  Weight: 166 lb (75.3 kg)  Height: 5\' 9"  (1.753 m)      Assessment & Plan:  Flu shot given at visit.

## 2016-09-25 NOTE — Progress Notes (Signed)
Pre visit review using our clinic review tool, if applicable. No additional management support is needed unless otherwise documented below in the visit note. 

## 2016-09-25 NOTE — Assessment & Plan Note (Signed)
Using for about 5 weeks now and doing well, no side effects and has helped with his symptoms.

## 2016-09-25 NOTE — Patient Instructions (Signed)
We have given you the flu shot today.  

## 2016-11-30 ENCOUNTER — Encounter: Payer: Self-pay | Admitting: Internal Medicine

## 2016-11-30 ENCOUNTER — Other Ambulatory Visit (INDEPENDENT_AMBULATORY_CARE_PROVIDER_SITE_OTHER): Payer: 59

## 2016-11-30 ENCOUNTER — Ambulatory Visit (INDEPENDENT_AMBULATORY_CARE_PROVIDER_SITE_OTHER): Payer: 59 | Admitting: Internal Medicine

## 2016-11-30 VITALS — BP 136/84 | HR 78 | Temp 98.7°F | Resp 16 | Ht 69.0 in | Wt 170.0 lb

## 2016-11-30 DIAGNOSIS — Z0000 Encounter for general adult medical examination without abnormal findings: Secondary | ICD-10-CM

## 2016-11-30 LAB — COMPREHENSIVE METABOLIC PANEL
ALBUMIN: 4.4 g/dL (ref 3.5–5.2)
ALT: 45 U/L (ref 0–53)
AST: 24 U/L (ref 0–37)
Alkaline Phosphatase: 46 U/L (ref 39–117)
BUN: 9 mg/dL (ref 6–23)
CHLORIDE: 106 meq/L (ref 96–112)
CO2: 29 meq/L (ref 19–32)
CREATININE: 1.06 mg/dL (ref 0.40–1.50)
Calcium: 9.2 mg/dL (ref 8.4–10.5)
GFR: 80.43 mL/min (ref >60.00)
GLUCOSE: 98 mg/dL (ref 70–99)
Potassium: 4.7 mEq/L (ref 3.5–5.1)
SODIUM: 142 meq/L (ref 135–145)
Total Bilirubin: 0.4 mg/dL (ref 0.2–1.2)
Total Protein: 7.1 g/dL (ref 6.0–8.3)

## 2016-11-30 LAB — CBC
HEMATOCRIT: 43.5 % (ref 39.0–52.0)
Hemoglobin: 14.9 g/dL (ref 13.0–17.0)
MCHC: 34.3 g/dL (ref 30.0–36.0)
MCV: 92.6 fl (ref 78.0–100.0)
Platelets: 197 10*3/uL (ref 150.0–400.0)
RBC: 4.7 Mil/uL (ref 4.22–5.81)
RDW: 12.5 % (ref 11.5–15.5)
WBC: 5.8 10*3/uL (ref 4.0–10.5)

## 2016-11-30 LAB — LIPID PANEL
CHOLESTEROL: 165 mg/dL (ref 0–200)
HDL: 47 mg/dL (ref >39.00)
LDL CALC: 88 mg/dL (ref 0–99)
NonHDL: 118.48
Total CHOL/HDL Ratio: 4
Triglycerides: 152 mg/dL — ABNORMAL HIGH (ref 0.0–149.0)
VLDL: 30.4 mg/dL (ref 0.0–40.0)

## 2016-11-30 NOTE — Assessment & Plan Note (Signed)
Checking labs, tetanus and flu shot up to date. Due colonoscopy at 51. Sees derm yearly for body check. Counseled about exercise and dangers of distracted driving. Given 10 year screening recommendations.

## 2016-11-30 NOTE — Patient Instructions (Signed)
We are checking the labs today and will send the results on mychart.  Think about doing some more exercise on the weekends.    Health Maintenance, Male A healthy lifestyle and preventative care can promote health and wellness.  Maintain regular health, dental, and eye exams.  Eat a healthy diet. Foods like vegetables, fruits, whole grains, low-fat dairy products, and lean protein foods contain the nutrients you need and are low in calories. Decrease your intake of foods high in solid fats, added sugars, and salt. Get information about a proper diet from your health care provider, if necessary.  Regular physical exercise is one of the most important things you can do for your health. Most adults should get at least 150 minutes of moderate-intensity exercise (any activity that increases your heart rate and causes you to sweat) each week. In addition, most adults need muscle-strengthening exercises on 2 or more days a week.   Maintain a healthy weight. The body mass index (BMI) is a screening tool to identify possible weight problems. It provides an estimate of body fat based on height and weight. Your health care provider can find your BMI and can help you achieve or maintain a healthy weight. For males 20 years and older:  A BMI below 18.5 is considered underweight.  A BMI of 18.5 to 24.9 is normal.  A BMI of 25 to 29.9 is considered overweight.  A BMI of 30 and above is considered obese.  Maintain normal blood lipids and cholesterol by exercising and minimizing your intake of saturated fat. Eat a balanced diet with plenty of fruits and vegetables. Blood tests for lipids and cholesterol should begin at age 44 and be repeated every 5 years. If your lipid or cholesterol levels are high, you are over age 44, or you are at high risk for heart disease, you may need your cholesterol levels checked more frequently.Ongoing high lipid and cholesterol levels should be treated with medicines if diet and  exercise are not working.  If you smoke, find out from your health care provider how to quit. If you do not use tobacco, do not start.  Lung cancer screening is recommended for adults aged 55-80 years who are at high risk for developing lung cancer because of a history of smoking. A yearly low-dose CT scan of the lungs is recommended for people who have at least a 30-pack-year history of smoking and are current smokers or have quit within the past 15 years. A pack year of smoking is smoking an average of 1 pack of cigarettes a day for 1 year (for example, a 30-pack-year history of smoking could mean smoking 1 pack a day for 30 years or 2 packs a day for 15 years). Yearly screening should continue until the smoker has stopped smoking for at least 15 years. Yearly screening should be stopped for people who develop a health problem that would prevent them from having lung cancer treatment.  If you choose to drink alcohol, do not have more than 2 drinks per day. One drink is considered to be 12 oz (360 mL) of beer, 5 oz (150 mL) of wine, or 1.5 oz (45 mL) of liquor.  Avoid the use of street drugs. Do not share needles with anyone. Ask for help if you need support or instructions about stopping the use of drugs.  High blood pressure causes heart disease and increases the risk of stroke. High blood pressure is more likely to develop in:  People who have  blood pressure in the end of the normal range (100-139/85-89 mm Hg).  People who are overweight or obese.  People who are African American.  If you are 50-61 years of age, have your blood pressure checked every 3-5 years. If you are 10 years of age or older, have your blood pressure checked every year. You should have your blood pressure measured twice-once when you are at a hospital or clinic, and once when you are not at a hospital or clinic. Record the average of the two measurements. To check your blood pressure when you are not at a hospital or  clinic, you can use:  An automated blood pressure machine at a pharmacy.  A home blood pressure monitor.  If you are 19-84 years old, ask your health care provider if you should take aspirin to prevent heart disease.  Diabetes screening involves taking a blood sample to check your fasting blood sugar level. This should be done once every 3 years after age 41 if you are at a normal weight and without risk factors for diabetes. Testing should be considered at a younger age or be carried out more frequently if you are overweight and have at least 1 risk factor for diabetes.  Colorectal cancer can be detected and often prevented. Most routine colorectal cancer screening begins at the age of 5 and continues through age 27. However, your health care provider may recommend screening at an earlier age if you have risk factors for colon cancer. On a yearly basis, your health care provider may provide home test kits to check for hidden blood in the stool. A small camera at the end of a tube may be used to directly examine the colon (sigmoidoscopy or colonoscopy) to detect the earliest forms of colorectal cancer. Talk to your health care provider about this at age 31 when routine screening begins. A direct exam of the colon should be repeated every 5-10 years through age 76, unless early forms of precancerous polyps or small growths are found.  People who are at an increased risk for hepatitis B should be screened for this virus. You are considered at high risk for hepatitis B if:  You were born in a country where hepatitis B occurs often. Talk with your health care provider about which countries are considered high risk.  Your parents were born in a high-risk country and you have not received a shot to protect against hepatitis B (hepatitis B vaccine).  You have HIV or AIDS.  You use needles to inject street drugs.  You live with, or have sex with, someone who has hepatitis B.  You are a man who has  sex with other men (MSM).  You get hemodialysis treatment.  You take certain medicines for conditions like cancer, organ transplantation, and autoimmune conditions.  Hepatitis C blood testing is recommended for all people born from [birth year] through 1965 and any individual with known risk factors for hepatitis C.  Healthy men should no longer receive prostate-specific antigen (PSA) blood tests as part of routine cancer screening. Talk to your health care provider about prostate cancer screening.  Testicular cancer screening is not recommended for adolescents or adult males who have no symptoms. Screening includes self-exam, a health care provider exam, and other screening tests. Consult with your health care provider about any symptoms you have or any concerns you have about testicular cancer.  Practice safe sex. Use condoms and avoid high-risk sexual practices to reduce the spread of sexually transmitted infections (  STIs).  You should be screened for STIs, including gonorrhea and chlamydia if:  You are sexually active and are younger than 24 years.  You are older than 24 years, and your health care provider tells you that you are at risk for this type of infection.  Your sexual activity has changed since you were last screened, and you are at an increased risk for chlamydia or gonorrhea. Ask your health care provider if you are at risk.  If you are at risk of being infected with HIV, it is recommended that you take a prescription medicine daily to prevent HIV infection. This is called pre-exposure prophylaxis (PrEP). You are considered at risk if:  You are a man who has sex with other men (MSM).  You are a heterosexual man who is sexually active with multiple partners.  You take drugs by injection.  You are sexually active with a partner who has HIV.  Talk with your health care provider about whether you are at high risk of being infected with HIV. If you choose to begin PrEP, you should  first be tested for HIV. You should then be tested every 3 months for as long as you are taking PrEP.  Use sunscreen. Apply sunscreen liberally and repeatedly throughout the day. You should seek shade when your shadow is shorter than you. Protect yourself by wearing long sleeves, pants, a wide-brimmed hat, and sunglasses year round whenever you are outdoors.  Tell your health care provider of new moles or changes in moles, especially if there is a change in shape or color. Also, tell your health care provider if a mole is larger than the size of a pencil eraser.  A one-time screening for abdominal aortic aneurysm (AAA) and surgical repair of large AAAs by ultrasound is recommended for men aged 88-75 years who are current or former smokers.  Stay current with your vaccines (immunizations). This information is not intended to replace advice given to you by your health care provider. Make sure you discuss any questions you have with your health care provider. Document Released: 06/07/2008 Document Revised: 12/31/2014 Document Reviewed: 09/13/2015 Elsevier Interactive Patient Education  2017 [employer].

## 2016-11-30 NOTE — Progress Notes (Signed)
   Subjective:    Patient ID: Gregory Vaughan, male    DOB: [birth year], 45 y.o.   MRN: [id number]  HPI The patient is a 45 YO man coming in for wellness. No new concerns.   [facility], [facility], social history reviewed and updated.   Review of Systems  Constitutional: Negative.   HENT: Negative.   Eyes: Negative.   Respiratory: Negative for cough, chest tightness and shortness of breath.   Cardiovascular: Negative for chest pain, palpitations and leg swelling.  Gastrointestinal: Positive for diarrhea. Negative for abdominal distention, abdominal pain, constipation, nausea and vomiting.       Intermittent  Musculoskeletal: Negative.   Skin: Negative.   Neurological: Negative.   Psychiatric/Behavioral: Negative.       Objective:   Physical Exam  Constitutional: He is oriented to person, place, and time. He appears well-developed and well-nourished.  HENT:  Head: Normocephalic and atraumatic.  Eyes: EOM are normal.  Neck: Normal range of motion.  Cardiovascular: Normal rate and regular rhythm.   Pulmonary/Chest: Effort normal and breath sounds normal. No respiratory distress. He has no wheezes. He has no rales.  Abdominal: Soft. Bowel sounds are normal. He exhibits no distension. There is no tenderness. There is no rebound.  Musculoskeletal: He exhibits no edema.  Neurological: He is alert and oriented to person, place, and time. Coordination normal.  Skin: Skin is warm and dry.  Psychiatric: He has a normal mood and affect.   Vitals:   11/30/16 0805  BP: 136/84  Pulse: 78  Resp: 16  Temp: 98.7 F (37.1 C)  TempSrc: Oral  SpO2: 96%  Weight: 170 lb (77.1 kg)  Height: 5\' 9"  (1.753 m)      Assessment & Plan:

## 2017-02-14 ENCOUNTER — Other Ambulatory Visit: Payer: Self-pay | Admitting: Internal Medicine

## 2017-04-02 ENCOUNTER — Ambulatory Visit (INDEPENDENT_AMBULATORY_CARE_PROVIDER_SITE_OTHER): Payer: 59 | Admitting: Family

## 2017-04-02 ENCOUNTER — Encounter: Payer: Self-pay | Admitting: Family

## 2017-04-02 VITALS — BP 118/78 | HR 81 | Temp 98.3°F | Resp 16 | Ht 69.0 in | Wt 168.4 lb

## 2017-04-02 DIAGNOSIS — J029 Acute pharyngitis, unspecified: Secondary | ICD-10-CM | POA: Diagnosis not present

## 2017-04-02 LAB — POCT RAPID STREP A (OFFICE): RAPID STREP A SCREEN: NEGATIVE

## 2017-04-02 MED ORDER — AMOXICILLIN 500 MG PO CAPS: 500.0000 mg | ORAL_CAPSULE | Freq: Two times a day (BID) | ORAL | 0 refills | Status: DC

## 2017-04-02 NOTE — Progress Notes (Signed)
Subjective:    Patient ID: Gregory Vaughan, male    DOB: 1971-10-01, 46 y.o.   MRN: [id number]  Chief Complaint  Patient presents with  . Sore Throat    sore throat, spots in the back of his throat, x3 days    HPI:  Gregory Vaughan is a 46 y.o. male who  has a past medical history of Anxiety; Depression; and IBS (irritable bowel syndrome). and presents today for an acute office visit.  This is a a new problem. Associated symptom of sore throat with spots in the back of his throat has been going on for about 3 days. He did feel feverish and achy in the past 24 hours. Modifying factors include ibuprofen which has helped with the sore throat pain. Course of the symptoms appear to be staying about the same. No sick contacts. No recent antibiotics.    Allergies  Allergen Reactions  . Codeine Hives      Outpatient Medications Prior to Visit  Medication Sig Dispense Refill  . DULoxetine (CYMBALTA) 30 MG capsule TAKE (1) CAPSULE DAILY. 90 capsule 3  . loperamide (IMODIUM) 2 MG capsule Take 2 mg by mouth as needed for diarrhea or loose stools. Takes 4 tablets daily     No facility-administered medications prior to visit.     Review of Systems  Constitutional: Positive for fever. Negative for chills.  HENT: Positive for sore throat. Negative for congestion, ear pain, sinus pain and sinus pressure.   Respiratory: Negative for chest tightness and shortness of breath.       Objective:    BP 118/78 (BP Location: Left Arm, Patient Position: Sitting, Cuff Size: Normal)   Pulse 81   Temp 98.3 F (36.8 C) (Oral)   Resp 16   Ht  (1.753 m)   Wt 168 lb 6.4 oz (76.4 kg)   SpO2 96%   BMI 24.87 kg/m  Nursing note and vital signs reviewed.  Physical Exam  HENT:  Right Ear: Hearing, tympanic membrane, external ear and ear canal normal.  Left Ear: Hearing, tympanic membrane, external ear and ear canal normal.  Nose: Nose normal. Right sinus exhibits no maxillary sinus tenderness  and no frontal sinus tenderness. Left sinus exhibits no maxillary sinus tenderness and no frontal sinus tenderness.  Mouth/Throat: Uvula is midline and mucous membranes are normal. Posterior oropharyngeal edema and posterior oropharyngeal erythema present. No oropharyngeal exudate or tonsillar abscesses.       Assessment & Plan:   Problem List Items Addressed This Visit      Other   Sore throat - Primary    In office strep test negative. Pharyngitis most likely viral. Continue conservative treatment with over-the-counter medications as needed for symptom relief and supportive care. Written prescription for amoxicillin provided if symptoms worsen or do not improve.      Relevant Orders   POCT rapid strep A (Completed)       I am having [name] start on amoxicillin. I am also having him maintain his loperamide and DULoxetine.   Meds ordered this encounter  Medications  . amoxicillin (AMOXIL) 500 MG capsule    Sig: Take 1 capsule (500 mg total) by mouth 2 (two) times daily.    Dispense:  14 capsule    Refill:  0    Order Specific Question:   Supervising Provider    Answer:   Hillard Danker A [[id number]]     Follow-up: Return if symptoms worsen or fail to improve.  Gregory Vaughan,  Gregory Vaughan, [place]

## 2017-04-02 NOTE — Patient Instructions (Signed)
Thank you for choosing [facility].  SUMMARY AND INSTRUCTIONS:  Please use OTC medications as needed for symptom relief.  If symptoms worsen or do not improve in the next 24-48 hours start the antibiotics.   Medication:  Your prescription(s) have been submitted to your pharmacy or been printed and provided for you. Please take as directed and contact our office if you believe you are having problem(s) with the medication(s) or have any questions.  Follow up:  If your symptoms worsen or fail to improve, please contact our office for further instruction, or in case of emergency go directly to the emergency room at the closest medical facility.   General Recommendations:    Please drink plenty of fluids.  Get plenty of rest   Sleep in humidified air  Use saline nasal sprays  Netti pot   OTC Medications:  Decongestants - helps relieve congestion   Flonase (generic fluticasone) or Nasacort (generic triamcinolone) - please make sure to use the "cross-over" technique at a 45 degree angle towards the opposite eye as opposed to straight up the nasal passageway.   Sudafed (generic pseudoephedrine - Note this is the one that is available behind the pharmacy counter); Products with phenylephrine (-PE) may also be used but is often not as effective as pseudoephedrine.   If you have HIGH BLOOD PRESSURE - Coricidin HBP; AVOID any product that is -D as this contains pseudoephedrine which may increase your blood pressure.  Afrin (oxymetazoline) every 6-8 hours for up to 3 days.   Allergies - helps relieve runny nose, itchy eyes and sneezing   Claritin (generic loratidine), Allegra (fexofenidine), or Zyrtec (generic cyrterizine) for runny nose. These medications should not cause drowsiness.  Note - Benadryl (generic diphenhydramine) may be used however may cause drowsiness  Cough -   Delsym or Robitussin (generic dextromethorphan)  Expectorants - helps loosen mucus to ease  removal   Mucinex (generic guaifenesin) as directed on the package.  Headaches / General Aches   Tylenol (generic acetaminophen) - DO NOT EXCEED 3 grams (3,000 mg) in a 24 hour time period  Advil/Motrin (generic ibuprofen)   Sore Throat -   Salt water gargle   Chloraseptic (generic benzocaine) spray or lozenges / Sucrets (generic dyclonine)

## 2017-04-02 NOTE — Assessment & Plan Note (Signed)
In office strep test negative. Pharyngitis most likely viral. Continue conservative treatment with over-the-counter medications as needed for symptom relief and supportive care. Written prescription for amoxicillin provided if symptoms worsen or do not improve.

## 2017-04-03 ENCOUNTER — Telehealth: Payer: Self-pay

## 2017-04-03 ENCOUNTER — Ambulatory Visit: Payer: Self-pay | Admitting: Internal Medicine

## 2017-04-03 MED ORDER — LIDOCAINE VISCOUS 2 % MT SOLN: OROMUCOSAL | 0 refills | Status: DC

## 2017-04-03 NOTE — Telephone Encounter (Signed)
Lidocaine solution sent to pharmacy. Recommend starting the antibiotic.

## 2017-04-03 NOTE — Telephone Encounter (Signed)
Patient came back in---was seen yesterday by you---states his throat is worse---can you please send something else in---send to [facility]---thanks

## 2017-04-03 NOTE — Telephone Encounter (Signed)
Advised patient of gregs note/instructions, patient will pick up new rx and will start antibiotic

## 2017-04-05 ENCOUNTER — Other Ambulatory Visit: Payer: 59

## 2017-04-05 ENCOUNTER — Encounter: Payer: Self-pay | Admitting: Nurse Practitioner

## 2017-04-05 ENCOUNTER — Ambulatory Visit (INDEPENDENT_AMBULATORY_CARE_PROVIDER_SITE_OTHER): Payer: 59 | Admitting: Nurse Practitioner

## 2017-04-05 VITALS — BP 126/88 | HR 59 | Temp 98.3°F | Ht 69.0 in | Wt 169.0 lb

## 2017-04-05 DIAGNOSIS — J029 Acute pharyngitis, unspecified: Secondary | ICD-10-CM

## 2017-04-05 LAB — POCT MONO (EPSTEIN BARR VIRUS): Mono, POC: NEGATIVE

## 2017-04-05 MED ORDER — KETOROLAC TROMETHAMINE 60 MG/2ML IM SOLN
60.0000 mg | Freq: Once | INTRAMUSCULAR | Status: AC
  Administered 2017-04-05: 60 mg via INTRAMUSCULAR

## 2017-04-05 MED ORDER — NAPROXEN 500 MG PO TABS: 500.0000 mg | ORAL_TABLET | Freq: Two times a day (BID) | ORAL | 0 refills | Status: DC | PRN

## 2017-04-05 NOTE — Progress Notes (Signed)
Pre visit review using our clinic review tool, if applicable. No additional management support is needed unless otherwise documented below in the visit note. 

## 2017-04-05 NOTE — Progress Notes (Signed)
Subjective:  Patient ID: Gregory Vaughan, male    DOB: [birth year]  Age: 46 y.o. MRN: [id number]  CC: Sore Throat (sore throat,had abx but not better)   Sore Throat   This is a new problem. The current episode started in the past 7 days. The problem has been unchanged. There has been no fever. Associated symptoms include swollen glands. Pertinent negatives include no abdominal pain, congestion, coughing, diarrhea, drooling, ear discharge, ear pain, headaches, hoarse voice, plugged ear sensation, neck pain, shortness of breath, stridor, trouble swallowing or vomiting. He has had no exposure to strep or mono. He has tried NSAIDs and gargles (and amoxicillin) for the symptoms. The treatment provided mild relief.    Outpatient Medications Prior to Visit  Medication Sig Dispense Refill  . amoxicillin (AMOXIL) 500 MG capsule Take 1 capsule (500 mg total) by mouth 2 (two) times daily. 14 capsule 0  . DULoxetine (CYMBALTA) 30 MG capsule TAKE (1) CAPSULE DAILY. 90 capsule 3  . lidocaine (XYLOCAINE) 2 % solution Swish and gargle with 10-20 ml every 3 hours as needed for sore throat. 100 mL 0  . loperamide (IMODIUM) 2 MG capsule Take 2 mg by mouth as needed for diarrhea or loose stools. Takes 4 tablets daily     No facility-administered medications prior to visit.     ROS See HPI  Objective:  BP 126/88   Pulse (!) 59   Temp 98.3 F (36.8 C)   Ht  (1.753 m)   Wt 169 lb (76.7 kg)   SpO2 99%   BMI 24.96 kg/m   BP Readings from Last 3 Encounters:  04/05/17 126/88  04/02/17 118/78  11/30/16 136/84    Wt Readings from Last 3 Encounters:  04/05/17 169 lb (76.7 kg)  04/02/17 168 lb 6.4 oz (76.4 kg)  11/30/16 170 lb (77.1 kg)    Physical Exam  Constitutional: He is oriented to person, place, and time. No distress.  HENT:  Right Ear: Tympanic membrane, external ear and ear canal normal.  Left Ear: Tympanic membrane and ear canal normal.  Nose: No mucosal edema or rhinorrhea. Right  sinus exhibits no maxillary sinus tenderness and no frontal sinus tenderness. Left sinus exhibits no maxillary sinus tenderness and no frontal sinus tenderness.  Mouth/Throat: Uvula is midline. No trismus in the jaw. Oropharyngeal exudate and posterior oropharyngeal erythema present. No tonsillar abscesses.  Eyes: No scleral icterus.  Neck: Normal range of motion. Neck supple.  Cardiovascular: Normal rate and regular rhythm.   Pulmonary/Chest: Effort normal and breath sounds normal.  Lymphadenopathy:    He has cervical adenopathy.  Neurological: He is alert and oriented to person, place, and time.  Vitals reviewed.   Lab Results  Component Value Date   WBC 5.8 11/30/2016   HGB 14.9 11/30/2016   HCT 43.5 11/30/2016   PLT 197.0 11/30/2016   GLUCOSE 98 11/30/2016   CHOL 165 11/30/2016   TRIG 152.0 (H) 11/30/2016   HDL 47.00 11/30/2016   LDLCALC 88 11/30/2016   ALT 45 11/30/2016   AST 24 11/30/2016   NA 142 11/30/2016   K 4.7 11/30/2016   CL 106 11/30/2016   CREATININE 1.06 11/30/2016   BUN 9 11/30/2016   CO2 29 11/30/2016    Ct Abdomen Wo Contrast  Result Date: 05/18/2009 Clinical Data:  Right lower quadrant pain, nausea, vomiting, diarrhea, history year rule bowel syndrome  CT ABDOMEN AND PELVIS WITHOUT CONTRAST  Technique:  Multidetector CT imaging of the abdomen and pelvis  was performed following the standard protocol without intravenous contrast. IV contrast not administered due to creatinine of 1.8. The patient received dilute oral contrast for study. Breast shield utilized.  Sagittal and coronal MPR images reconstructed from axial data set.  Comparison:  None  CT ABDOMEN  Findings: Lung bases clear. Within limits of a nonenhanced exam, no focal abnormalities of the liver, spleen, pancreas, kidneys, or adrenal glands. Stomach and upper abdominal bowel loops unremarkable. No mass, adenopathy, free fluid, or inflammatory process.  IMPRESSION: No acute upper abdominal abnormalities.   CT PELVIS  Findings: Normal appendix. Large and small bowel loops in pelvis normal. Contrast present to rectum. No pelvic mass, adenopathy, free fluid, or inflammatory process. Bladder unremarkable. No focal bony abnormalities.  IMPRESSION: No acute intrapelvic abnormalities. Provider: Belva Crome  Ct Pelvis Wo Contrast  Result Date: 05/18/2009 Clinical Data:  Right lower quadrant pain, nausea, vomiting, diarrhea, history year rule bowel syndrome  CT ABDOMEN AND PELVIS WITHOUT CONTRAST  Technique:  Multidetector CT imaging of the abdomen and pelvis was performed following the standard protocol without intravenous contrast. IV contrast not administered due to creatinine of 1.8. The patient received dilute oral contrast for study. Breast shield utilized.  Sagittal and coronal MPR images reconstructed from axial data set.  Comparison:  None  CT ABDOMEN  Findings: Lung bases clear. Within limits of a nonenhanced exam, no focal abnormalities of the liver, spleen, pancreas, kidneys, or adrenal glands. Stomach and upper abdominal bowel loops unremarkable. No mass, adenopathy, free fluid, or inflammatory process.  IMPRESSION: No acute upper abdominal abnormalities.  CT PELVIS  Findings: Normal appendix. Large and small bowel loops in pelvis normal. Contrast present to rectum. No pelvic mass, adenopathy, free fluid, or inflammatory process. Bladder unremarkable. No focal bony abnormalities.  IMPRESSION: No acute intrapelvic abnormalities. Provider: Belva Crome   Assessment & Plan:   Maksymilian was seen today for sore throat.  Diagnoses and all orders for this visit:  Acute pharyngitis, unspecified etiology -     ketorolac (TORADOL) injection 60 mg; Inject 2 mLs (60 mg total) into the muscle once. -     Upper Respiratory Culture; Future -     POCT Mono (Epstein Barr Virus) -     naproxen (NAPROSYN) 500 MG tablet; Take 1 tablet (500 mg total) by mouth 2 (two) times daily as needed (for pain, take with  food).   I am having Mr. Novacek start on naproxen. I am also having him maintain his loperamide, DULoxetine, amoxicillin, and lidocaine. We administered ketorolac.  Meds ordered this encounter  Medications  . ketorolac (TORADOL) injection 60 mg  . naproxen (NAPROSYN) 500 MG tablet    Sig: Take 1 tablet (500 mg total) by mouth 2 (two) times daily as needed (for pain, take with food).    Dispense:  30 tablet    Refill:  0    Order Specific Question:   Supervising Provider    Answer:   Tresa Garter [[id number]]    Follow-up: Return if symptoms worsen or fail to improve.  Alysia Penna, NP

## 2017-04-05 NOTE — Patient Instructions (Signed)
Mono test was negative. You will be contacted with throat culture results. Complete oral abx as prescribed. You will be referred to ENT if normal throat culture.  Pharyngitis Pharyngitis is redness, pain, and swelling (inflammation) of your pharynx. What are the causes? Pharyngitis is usually caused by infection. Most of the time, these infections are from viruses (viral) and are part of a cold. However, sometimes pharyngitis is caused by bacteria (bacterial). Pharyngitis can also be caused by allergies. Viral pharyngitis may be spread from person to person by coughing, sneezing, and personal items or utensils (cups, forks, spoons, toothbrushes). Bacterial pharyngitis may be spread from person to person by more intimate contact, such as kissing. What are the signs or symptoms? Symptoms of pharyngitis include:  Sore throat.  Tiredness (fatigue).  Low-grade fever.  Headache.  Joint pain and muscle aches.  Skin rashes.  Swollen lymph nodes.  Plaque-like film on throat or tonsils (often seen with bacterial pharyngitis). How is this diagnosed? Your health care provider will ask you questions about your illness and your symptoms. Your medical history, along with a physical exam, is often all that is needed to diagnose pharyngitis. Sometimes, a rapid strep test is done. Other lab tests may also be done, depending on the suspected cause. How is this treated? Viral pharyngitis will usually get better in 3-4 days without the use of medicine. Bacterial pharyngitis is treated with medicines that kill germs (antibiotics). Follow these instructions at home:  Drink enough water and fluids to keep your urine clear or pale yellow.  Only take over-the-counter or prescription medicines as directed by your health care provider:  If you are prescribed antibiotics, make sure you finish them even if you start to feel better.  Do not take aspirin.  Get lots of rest.  Gargle with 8 oz of salt water  ( tsp of salt per 1 qt of water) as often as every 1-2 hours to soothe your throat.  Throat lozenges (if you are not at risk for choking) or sprays may be used to soothe your throat. Contact a health care provider if:  You have large, tender lumps in your neck.  You have a rash.  You cough up green, yellow-brown, or bloody spit. Get help right away if:  Your neck becomes stiff.  You drool or are unable to swallow liquids.  You vomit or are unable to keep medicines or liquids down.  You have severe pain that does not go away with the use of recommended medicines.  You have trouble breathing (not caused by a stuffy nose). This information is not intended to replace advice given to you by your health care provider. Make sure you discuss any questions you have with your health care provider. Document Released: 12/10/2005 Document Revised: 05/17/2016 Document Reviewed: 08/17/2013 Elsevier Interactive Patient Education  2017 [employer].

## 2017-04-06 ENCOUNTER — Encounter: Payer: Self-pay | Admitting: Family Medicine

## 2017-04-06 ENCOUNTER — Ambulatory Visit (INDEPENDENT_AMBULATORY_CARE_PROVIDER_SITE_OTHER): Payer: 59 | Admitting: Family Medicine

## 2017-04-06 VITALS — BP 124/78 | HR 70 | Temp 98.2°F | Ht 69.0 in | Wt 167.0 lb

## 2017-04-06 DIAGNOSIS — B085 Enteroviral vesicular pharyngitis: Secondary | ICD-10-CM | POA: Diagnosis not present

## 2017-04-06 MED ORDER — KETOROLAC TROMETHAMINE 60 MG/2ML IM SOLN
60.0000 mg | Freq: Once | INTRAMUSCULAR | Status: AC
  Administered 2017-04-06: 60 mg via INTRAMUSCULAR

## 2017-04-06 NOTE — Addendum Note (Signed)
Addended by: Dorian Pod on: 04/06/2017 10:52 AM   Modules accepted: Orders

## 2017-04-06 NOTE — Patient Instructions (Signed)
Follow up as needed/scheduled No ibuprofen or Aleve today but start first thing tomorrow w/ either  Ibuprofen (4 OTC tabs) or Naproxen.  Ibuprofen will be 3x/day w/ meals and Naproxen would be twice daily w/ food Drink plenty of fluids Gargle w/ salt water and lidocaine for pain relief Call with any questions or concerns Hang in there!!!

## 2017-04-06 NOTE — Progress Notes (Signed)
   Subjective:    Patient ID: Gregory Vaughan, male    DOB: 10/24/1971, 46 y.o.   MRN: [id number]  HPI Sore throat- pt was seen 4/10 and 4/13 for similar issue.  Pt was started on Amoxicillin on Tuesday despite (-) strep test.  Throat culture was sent yesterday and mono test was negative.  Pt got Toradol shot yesterday for pain relief b/c he is not able to eat or drink.  Pt reports no relief w/ Lidocaine- 'my whole mouth goes numb but it's still painful to swallow'.  No recent abx prior to amox on Tuesday.  No fevers.  No one w/ similar sxs.   Review of Systems For ROS see HPI     Objective:   Physical Exam  Constitutional: He is oriented to person, place, and time. He appears well-developed and well-nourished. No distress.  HENT:  Head: Normocephalic and atraumatic.  Nose: Nose normal.  Pharyngeal ulcers consistent w/ hand/foot/mouth  Neck: Normal range of motion. Neck supple.  Lymphadenopathy:    He has no cervical adenopathy.  Neurological: He is alert and oriented to person, place, and time.  Skin: Skin is warm and dry.  Psychiatric: He has a normal mood and affect. His behavior is normal. Thought content normal.  Vitals reviewed.         Assessment & Plan:  Hand/foot/mouth- new to provider, ongoing for pt.  No relief w/ Lidocaine.  Did feel better after Toradol yesterday.  Will repeat shot today and pt to start scheduled NSAIDs tomorrow.  Reviewed supportive care and red flags that should prompt return.  Pt expressed understanding and is in agreement w/ plan.

## 2017-04-06 NOTE — Progress Notes (Signed)
Pre visit review using our clinic review tool, if applicable. No additional management support is needed unless otherwise documented below in the visit note. 

## 2017-04-07 LAB — CULTURE, UPPER RESPIRATORY: Organism ID, Bacteria: NORMAL

## 2017-10-13 ENCOUNTER — Encounter (HOSPITAL_COMMUNITY): Payer: Self-pay

## 2017-10-13 ENCOUNTER — Emergency Department (HOSPITAL_COMMUNITY): Payer: 59

## 2017-10-13 ENCOUNTER — Emergency Department (HOSPITAL_COMMUNITY)
Admission: EM | Admit: 2017-10-13 | Discharge: 2017-10-13 | Disposition: A | Discharge status: Home / Self Care | Payer: 59 | Attending: Emergency Medicine | Admitting: Emergency Medicine

## 2017-10-13 DIAGNOSIS — Z23 Encounter for immunization: Secondary | ICD-10-CM | POA: Insufficient documentation

## 2017-10-13 DIAGNOSIS — W260XXA Contact with knife, initial encounter: Secondary | ICD-10-CM | POA: Insufficient documentation

## 2017-10-13 DIAGNOSIS — S61412A Laceration without foreign body of left hand, initial encounter: Secondary | ICD-10-CM

## 2017-10-13 DIAGNOSIS — Z87891 Personal history of nicotine dependence: Secondary | ICD-10-CM | POA: Insufficient documentation

## 2017-10-13 DIAGNOSIS — Z79899 Other long term (current) drug therapy: Secondary | ICD-10-CM | POA: Diagnosis not present

## 2017-10-13 DIAGNOSIS — S6992XA Unspecified injury of left wrist, hand and finger(s), initial encounter: Secondary | ICD-10-CM | POA: Diagnosis present

## 2017-10-13 DIAGNOSIS — Y999 Unspecified external cause status: Secondary | ICD-10-CM | POA: Insufficient documentation

## 2017-10-13 DIAGNOSIS — Y9389 Activity, other specified: Secondary | ICD-10-CM | POA: Insufficient documentation

## 2017-10-13 DIAGNOSIS — Y929 Unspecified place or not applicable: Secondary | ICD-10-CM | POA: Diagnosis not present

## 2017-10-13 MED ORDER — TETANUS-DIPHTH-ACELL PERTUSSIS 5-2.5-18.5 LF-MCG/0.5 IM SUSP
0.5000 mL | Freq: Once | INTRAMUSCULAR | Status: AC
Start: 2017-10-13 — End: 2017-10-13
  Administered 2017-10-13: 0.5 mL via INTRAMUSCULAR
  Filled 2017-10-13: qty 0.5

## 2017-10-13 MED ORDER — LIDOCAINE HCL (PF) 1 % IJ SOLN
5.0000 mL | Freq: Once | INTRAMUSCULAR | Status: DC
  Filled 2017-10-13: qty 5

## 2017-10-13 NOTE — ED Notes (Signed)
Lidocaine used by NP

## 2017-10-13 NOTE — ED Provider Notes (Signed)
[facility]-EMERGENCY DEPT Provider Note   CSN: [id number] Arrival date & time: 10/13/17  1527     History   Chief Complaint Chief Complaint  Patient presents with  . Laceration    HPI Gregory Vaughan is a 46 y.o. male who presents to the ED with a laceration. The laceration occurred just prior to arrival to the ED. Patient was using a carving knife and it slipped and cut his left hand. Patient unsure of last tetanus. Patient states that it felt like the knife went to the bone.  HPI  Past Medical History:  Diagnosis Date  . Anxiety   . Depression   . IBS (irritable bowel syndrome)     Patient Active Problem List   Diagnosis Date Noted  . Sore throat 04/02/2017  . Routine general medical examination at a health care facility 11/30/2016  . OSA on CPAP 05/31/2016  . Generalized anxiety disorder 12/02/2015  . Chronic diarrhea 05/11/2014    History reviewed. No pertinent surgical history.     Home Medications    Prior to Admission medications   Medication Sig Start Date End Date Taking? Authorizing Provider  amoxicillin (AMOXIL) 500 MG capsule Take 1 capsule (500 mg total) by mouth 2 (two) times daily. 04/02/17   Veryl Speakalone, Gregory D, FNP  DULoxetine (CYMBALTA) 30 MG capsule TAKE (1) CAPSULE DAILY. 02/14/17   Myrlene Brokerrawford, Elizabeth A, MD  lidocaine (XYLOCAINE) 2 % solution Swish and gargle with 10-20 ml every 3 hours as needed for sore throat. 04/03/17   Veryl Speakalone, Gregory D, FNP  loperamide (IMODIUM) 2 MG capsule Take 2 mg by mouth as needed for diarrhea or loose stools. Takes 4 tablets daily    [provider]  naproxen (NAPROSYN) 500 MG tablet Take 1 tablet (500 mg total) by mouth 2 (two) times daily as needed (for pain, take with food). 04/05/17   Nche, Bonna Gainsharlotte Lum, NP    Family History Family History  Problem Relation Age of Onset  . Diabetes Mother        uncle  . Heart disease Mother        father  . Stomach cancer Mother   . Colon  cancer Neg Hx     Social History Social History  Substance Use Topics  . Smoking status: Former Games developermoker  . Smokeless tobacco: Current User    Types: Snuff  . Alcohol use 0.0 oz/week     Comment: 0-3 per day     Allergies   Codeine   Review of Systems Review of Systems   Physical Exam Updated Vital Signs BP 135/89 (BP Location: Right Arm)   Pulse 71   Temp 98.6 F (37 C) (Oral)   Resp 16   SpO2 99%   Physical Exam  Constitutional: He appears well-developed and well-nourished. No distress.  HENT:  Head: Normocephalic and atraumatic.  Eyes: EOM are normal.  Neck: Neck supple.  Cardiovascular: Normal rate.   Pulmonary/Chest: Effort normal.  Musculoskeletal: Normal range of motion.       Left hand: He exhibits tenderness and laceration. He exhibits normal range of motion and normal capillary refill. Normal sensation noted. Normal strength noted. He exhibits no thumb/finger opposition.       Hands: Laceration to the dorsum of the left hand at the base of the index finger, full flexion and extension of the finger, good strength adequate circulation.   Neurological: He is alert.  Skin: Skin is warm and dry.  Psychiatric: He has a  normal mood and affect.  Nursing note and vitals reviewed.    ED Treatments / Results  Labs (all labs ordered are listed, but only abnormal results are displayed) Labs Reviewed - No data to display  Radiology Dg Hand Complete Left  Result Date: 10/13/2017 CLINICAL DATA:  Pain to 2nd mcp joint s/p puncture wound today. Relates that a carving knife slipped while he was sculpting wood today. EXAM: LEFT HAND - COMPLETE 3+ VIEW COMPARISON:  None. FINDINGS: There is no evidence of fracture or dislocation. There is no evidence of arthropathy or other focal bone abnormality. There is ulnar minus variance. Soft tissues are unremarkable. IMPRESSION: No acute osseous injury of the left hand. Electronically Signed   By: Elige Ko   On: 10/13/2017  16:51    Procedures .Marland KitchenLaceration Repair Date/Time: 10/13/2017 5:16 PM Performed by: Janne Napoleon Authorized by: Janne Napoleon   Consent:    Consent obtained:  Verbal   Consent given by:  Patient   Risks discussed:  Infection   Alternatives discussed:  No treatment Anesthesia (see MAR for exact dosages):    Anesthesia method:  Local infiltration   Local anesthetic:  Lidocaine 1% w/o epi Laceration details:    Location:  Hand   Hand location:  L hand, dorsum   Length (cm):  2 Repair type:    Repair type:  Simple Pre-procedure details:    Preparation:  Patient was prepped and draped in usual sterile fashion and imaging obtained to evaluate for foreign bodies Exploration:    Hemostasis achieved with:  Direct pressure   Wound exploration: wound explored through full range of motion and entire depth of wound probed and visualized     Wound extent: no nerve damage noted, no tendon damage noted and no underlying fracture noted   Treatment:    Area cleansed with:  Saline   Amount of cleaning:  Standard   Irrigation solution:  Sterile saline   Irrigation method:  Syringe Skin repair:    Repair method:  Sutures   Suture size:  4-0   Suture material:  Prolene   Suture technique:  Simple interrupted   Number of sutures:  3 Approximation:    Approximation:  Close Post-procedure details:    Dressing:  Sterile dressing   Patient tolerance of procedure:  Tolerated well, no immediate complications Comments:     Tetanus updated.   (including critical care time)  Medications Ordered in ED Medications  lidocaine (PF) (XYLOCAINE) 1 % injection 5 mL (not administered)  Tdap (BOOSTRIX) injection 0.5 mL (0.5 mLs Intramuscular Given 10/13/17 1625)     Initial Impression / Assessment and Plan / ED Course  I have reviewed the triage vital signs and the nursing notes. 46 y.o. male with laceration to the dorsum of the left hand stable for d/c without focal neuro deficits. Patient to f/u  in one week for suture removal or sooner for any problems. Return precautions discussed.   Final Clinical Impressions(s) / ED Diagnoses   Final diagnoses:  Laceration of left hand without foreign body, initial encounter    New Prescriptions New Prescriptions   No medications on file     Janne Napoleon, NP 10/13/17 1719    Rolland Porter, MD 11/01/17 2255

## 2017-10-13 NOTE — ED Triage Notes (Signed)
Pt presents with a laceration to his L index finger. He reports that he was spoon carving and accidentally cut it. Bleeding controlled with gauze at this time. A&Ox4.

## 2017-10-13 NOTE — Discharge Instructions (Signed)
Follow up with your doctor, urgent care or here in one week for wound check and suture removal. Return sooner for any problems.

## 2017-10-13 NOTE — ED Notes (Signed)
Pt declined discharge vitals

## 2017-12-10 ENCOUNTER — Other Ambulatory Visit (INDEPENDENT_AMBULATORY_CARE_PROVIDER_SITE_OTHER): Payer: 59

## 2017-12-10 ENCOUNTER — Ambulatory Visit (INDEPENDENT_AMBULATORY_CARE_PROVIDER_SITE_OTHER): Payer: 59 | Admitting: Internal Medicine

## 2017-12-10 ENCOUNTER — Encounter: Payer: Self-pay | Admitting: Internal Medicine

## 2017-12-10 VITALS — BP 124/82 | HR 97 | Temp 98.1°F | Ht 69.0 in | Wt 165.0 lb

## 2017-12-10 DIAGNOSIS — R5383 Other fatigue: Secondary | ICD-10-CM | POA: Diagnosis not present

## 2017-12-10 DIAGNOSIS — Z23 Encounter for immunization: Secondary | ICD-10-CM

## 2017-12-10 LAB — CBC
HCT: 47.1 % (ref 39.0–52.0)
HEMOGLOBIN: 15.7 g/dL (ref 13.0–17.0)
MCHC: 33.5 g/dL (ref 30.0–36.0)
MCV: 95.1 fl (ref 78.0–100.0)
PLATELETS: 219 10*3/uL (ref 150.0–400.0)
RBC: 4.95 Mil/uL (ref 4.22–5.81)
RDW: 12.5 % (ref 11.5–15.5)
WBC: 7 10*3/uL (ref 4.0–10.5)

## 2017-12-10 LAB — COMPREHENSIVE METABOLIC PANEL
ALT: 21 U/L (ref 0–53)
AST: 19 U/L (ref 0–37)
Albumin: 4.4 g/dL (ref 3.5–5.2)
Alkaline Phosphatase: 57 U/L (ref 39–117)
BUN: 15 mg/dL (ref 6–23)
CALCIUM: 9.1 mg/dL (ref 8.4–10.5)
CHLORIDE: 101 meq/L (ref 96–112)
CO2: 34 meq/L — AB (ref 19–32)
Creatinine, Ser: 1.08 mg/dL (ref 0.40–1.50)
GFR: 78.35 mL/min (ref >60.00)
GLUCOSE: 113 mg/dL — AB (ref 70–99)
Potassium: 4.1 mEq/L (ref 3.5–5.1)
Sodium: 142 mEq/L (ref 135–145)
Total Bilirubin: 0.4 mg/dL (ref 0.2–1.2)
Total Protein: 7.2 g/dL (ref 6.0–8.3)

## 2017-12-10 LAB — LIPID PANEL
Cholesterol: 145 mg/dL (ref 0–200)
HDL: 64.9 mg/dL (ref >39.00)
LDL CALC: 53 mg/dL (ref 0–99)
NONHDL: 79.63
Total CHOL/HDL Ratio: 2
Triglycerides: 132 mg/dL (ref 0.0–149.0)
VLDL: 26.4 mg/dL (ref 0.0–40.0)

## 2017-12-10 LAB — VITAMIN B12: Vitamin B-12: 192 pg/mL — ABNORMAL LOW (ref 211–911)

## 2017-12-10 LAB — VITAMIN D 25 HYDROXY (VIT D DEFICIENCY, FRACTURES): VITD: 16.47 ng/mL — AB (ref 30.00–100.00)

## 2017-12-10 LAB — HEMOGLOBIN A1C: Hgb A1c MFr Bld: 5.5 % (ref 4.6–6.5)

## 2017-12-10 LAB — TSH: TSH: 1.21 u[IU]/mL (ref 0.35–4.50)

## 2017-12-10 LAB — T4, FREE: FREE T4: 0.61 ng/dL (ref 0.60–1.60)

## 2017-12-10 NOTE — Patient Instructions (Signed)
We will check the labs today and send you the results.  We have given you the flu shot today.  Consider adding back some regular exercise to help promote more energy.

## 2017-12-10 NOTE — Assessment & Plan Note (Signed)
Checking thyroid, B12, vitamin D, CMP, CBC, HgA1c to rule out metabolic etiology. He does have concurrent depression but this is currently well controlled.

## 2017-12-10 NOTE — Progress Notes (Signed)
   Subjective:    Patient ID: Gregory Vaughan, male    DOB: Mar 11, 1971, 46 y.o.   MRN: [id number]  HPI The patient is a 46 YO man coming in for low energy levels. He is concerned about vitamin problems. He denies change in diet. Still non-smoker. Weight is stable. Denies heat or cold intolerance. Not exercising. Going on about 2 months or so. He has had more depression in the last 6 months and seeing psych and they have adjusted his cymbalta and also he has seen an attention specialist and is now on vyvanse. He denies feeling depressed or anxious. Not exercising  Review of Systems  Constitutional: Positive for activity change and fatigue. Negative for appetite change and unexpected weight change.  Respiratory: Negative for cough, chest tightness and shortness of breath.   Cardiovascular: Negative for chest pain, palpitations and leg swelling.  Gastrointestinal: Negative for abdominal distention, abdominal pain, constipation, diarrhea, nausea and vomiting.  Musculoskeletal: Negative.   Skin: Negative.   Neurological: Negative.   Psychiatric/Behavioral: Negative.       Objective:   Physical Exam  Constitutional: He is oriented to person, place, and time. He appears well-developed and well-nourished.  HENT:  Head: Normocephalic and atraumatic.  Eyes: EOM are normal.  Neck: Normal range of motion.  Cardiovascular: Normal rate and regular rhythm.  Pulmonary/Chest: Effort normal and breath sounds normal. No respiratory distress. He has no wheezes. He has no rales.  Abdominal: Soft. Bowel sounds are normal. He exhibits no distension. There is no tenderness. There is no rebound.  Musculoskeletal: He exhibits no edema.  Neurological: He is alert and oriented to person, place, and time. Coordination normal.  Skin: Skin is warm and dry.  Psychiatric: He has a normal mood and affect.   Vitals:   12/10/17 0843  BP: 124/82  Pulse: 97  Temp: 98.1 F (36.7 C)  TempSrc: Oral  SpO2: 98%    Weight: 165 lb (74.8 kg)  Height: 5\' 9"  (1.753 m)      Assessment & Plan:  Flu shot given at visit

## 2017-12-12 ENCOUNTER — Other Ambulatory Visit: Payer: Self-pay | Admitting: Internal Medicine

## 2017-12-12 MED ORDER — VITAMIN D (ERGOCALCIFEROL) 1.25 MG (50000 UNIT) PO CAPS: 50000.0000 [IU] | ORAL_CAPSULE | ORAL | 0 refills | Status: DC

## 2017-12-13 ENCOUNTER — Other Ambulatory Visit: Payer: Self-pay

## 2017-12-13 ENCOUNTER — Encounter: Payer: Self-pay | Admitting: Internal Medicine

## 2017-12-13 ENCOUNTER — Ambulatory Visit (INDEPENDENT_AMBULATORY_CARE_PROVIDER_SITE_OTHER): Payer: 59

## 2017-12-13 DIAGNOSIS — E538 Deficiency of other specified B group vitamins: Secondary | ICD-10-CM | POA: Diagnosis not present

## 2017-12-13 MED ORDER — VITAMIN D (ERGOCALCIFEROL) 1.25 MG (50000 UNIT) PO CAPS: 50000.0000 [IU] | ORAL_CAPSULE | ORAL | 0 refills | Status: DC

## 2017-12-13 MED ORDER — CYANOCOBALAMIN 1000 MCG/ML IJ SOLN
1000.0000 ug | Freq: Once | INTRAMUSCULAR | Status: AC
  Administered 2017-12-13: 1000 ug via INTRAMUSCULAR

## 2017-12-13 NOTE — Progress Notes (Signed)
Medical treatment/procedure(s) were performed by non-physician practitioner and as supervising physician I was immediately available for consultation/collaboration. I agree with above. Marliyah Reid A Francia Verry, MD  

## 2017-12-23 ENCOUNTER — Ambulatory Visit (INDEPENDENT_AMBULATORY_CARE_PROVIDER_SITE_OTHER): Payer: 59 | Admitting: *Deleted

## 2017-12-23 DIAGNOSIS — E538 Deficiency of other specified B group vitamins: Secondary | ICD-10-CM | POA: Diagnosis not present

## 2017-12-23 MED ORDER — CYANOCOBALAMIN 1000 MCG/ML IJ SOLN
1000.0000 ug | Freq: Once | INTRAMUSCULAR | Status: AC
  Administered 2017-12-23: 1000 ug via INTRAMUSCULAR

## 2017-12-30 ENCOUNTER — Ambulatory Visit: Payer: 59

## 2018-01-03 ENCOUNTER — Encounter: Payer: Self-pay | Admitting: Internal Medicine

## 2018-01-07 ENCOUNTER — Ambulatory Visit (INDEPENDENT_AMBULATORY_CARE_PROVIDER_SITE_OTHER): Payer: 59 | Admitting: *Deleted

## 2018-01-07 DIAGNOSIS — E538 Deficiency of other specified B group vitamins: Secondary | ICD-10-CM | POA: Diagnosis not present

## 2018-01-07 MED ORDER — CYANOCOBALAMIN 1000 MCG/ML IJ SOLN
1000.0000 ug | Freq: Once | INTRAMUSCULAR | Status: AC
  Administered 2018-01-07: 1000 ug via INTRAMUSCULAR

## 2018-01-07 NOTE — Progress Notes (Signed)
Medical treatment/procedure(s) were performed by non-physician practitioner and as supervising physician I was immediately available for consultation/collaboration. I agree with above. Elizabeth A Crawford, MD  

## 2018-01-13 ENCOUNTER — Ambulatory Visit: Payer: 59

## 2018-01-20 ENCOUNTER — Ambulatory Visit (INDEPENDENT_AMBULATORY_CARE_PROVIDER_SITE_OTHER): Payer: 59 | Admitting: *Deleted

## 2018-01-20 DIAGNOSIS — E538 Deficiency of other specified B group vitamins: Secondary | ICD-10-CM | POA: Diagnosis not present

## 2018-01-20 MED ORDER — CYANOCOBALAMIN 1000 MCG/ML IJ SOLN
1000.0000 ug | Freq: Once | INTRAMUSCULAR | Status: AC
Start: 2018-01-20 — End: 2018-01-20
  Administered 2018-01-20: 1000 ug via INTRAMUSCULAR

## 2018-01-20 NOTE — Progress Notes (Signed)
Medical treatment/procedure(s) were performed by non-physician practitioner and as supervising physician I was immediately available for consultation/collaboration. I agree with above. Elizabeth A Crawford, MD  

## 2018-01-27 ENCOUNTER — Ambulatory Visit (INDEPENDENT_AMBULATORY_CARE_PROVIDER_SITE_OTHER): Payer: 59

## 2018-01-27 DIAGNOSIS — E538 Deficiency of other specified B group vitamins: Secondary | ICD-10-CM

## 2018-01-27 MED ORDER — CYANOCOBALAMIN 1000 MCG/ML IJ SOLN
1000.0000 ug | Freq: Once | INTRAMUSCULAR | Status: AC
Start: 2018-01-27 — End: 2018-01-27
  Administered 2018-01-27: 1000 ug via INTRAMUSCULAR

## 2018-01-27 NOTE — Progress Notes (Addendum)
b12 Injection given.   Stacy J Burns, MD  

## 2018-02-03 ENCOUNTER — Ambulatory Visit (INDEPENDENT_AMBULATORY_CARE_PROVIDER_SITE_OTHER): Payer: 59

## 2018-02-03 DIAGNOSIS — E538 Deficiency of other specified B group vitamins: Secondary | ICD-10-CM | POA: Diagnosis not present

## 2018-02-03 MED ORDER — CYANOCOBALAMIN 1000 MCG/ML IJ SOLN
1000.0000 ug | Freq: Once | INTRAMUSCULAR | Status: AC
  Administered 2018-02-03: 1000 ug via INTRAMUSCULAR

## 2018-02-03 NOTE — Progress Notes (Signed)
Medical treatment/procedure(s) were performed by non-physician practitioner and as supervising physician I was immediately available for consultation/collaboration. I agree with above. Dmiya Malphrus A Aqua Denslow, MD  

## 2018-02-10 ENCOUNTER — Ambulatory Visit: Payer: 59

## 2018-02-27 ENCOUNTER — Encounter (HOSPITAL_COMMUNITY): Payer: Self-pay

## 2018-02-27 ENCOUNTER — Emergency Department (HOSPITAL_COMMUNITY)
Admission: EM | Admit: 2018-02-27 | Discharge: 2018-02-28 | Disposition: A | Discharge status: Other Acute Inpatient Hospital | Payer: 59 | Attending: Emergency Medicine | Admitting: Emergency Medicine

## 2018-02-27 DIAGNOSIS — G4730 Sleep apnea, unspecified: Secondary | ICD-10-CM | POA: Diagnosis not present

## 2018-02-27 DIAGNOSIS — F17200 Nicotine dependence, unspecified, uncomplicated: Secondary | ICD-10-CM | POA: Insufficient documentation

## 2018-02-27 DIAGNOSIS — R45851 Suicidal ideations: Secondary | ICD-10-CM | POA: Insufficient documentation

## 2018-02-27 DIAGNOSIS — F329 Major depressive disorder, single episode, unspecified: Secondary | ICD-10-CM | POA: Diagnosis present

## 2018-02-27 DIAGNOSIS — Z79899 Other long term (current) drug therapy: Secondary | ICD-10-CM | POA: Insufficient documentation

## 2018-02-27 DIAGNOSIS — F32A Depression, unspecified: Secondary | ICD-10-CM

## 2018-02-27 DIAGNOSIS — Z9981 Dependence on supplemental oxygen: Secondary | ICD-10-CM | POA: Diagnosis not present

## 2018-02-27 HISTORY — DX: Sleep apnea, unspecified: G47.30

## 2018-02-27 LAB — COMPREHENSIVE METABOLIC PANEL
ALBUMIN: 4.3 g/dL (ref 3.5–5.0)
ALT: 27 U/L (ref 17–63)
ANION GAP: 12 (ref 5–15)
AST: 26 U/L (ref 15–41)
Alkaline Phosphatase: 57 U/L (ref 38–126)
BUN: 6 mg/dL (ref 6–20)
CALCIUM: 9.5 mg/dL (ref 8.9–10.3)
CO2: 25 mmol/L (ref 22–32)
Chloride: 101 mmol/L (ref 101–111)
Creatinine, Ser: 1.05 mg/dL (ref 0.61–1.24)
GFR calc non Af Amer: >60 mL/min (ref >60)
GLUCOSE: 103 mg/dL — AB (ref 65–99)
POTASSIUM: 4.6 mmol/L (ref 3.5–5.1)
SODIUM: 138 mmol/L (ref 135–145)
TOTAL PROTEIN: 7 g/dL (ref 6.5–8.1)
Total Bilirubin: 0.9 mg/dL (ref 0.3–1.2)

## 2018-02-27 LAB — RAPID URINE DRUG SCREEN, HOSP PERFORMED
Amphetamines: POSITIVE — AB
Barbiturates: NOT DETECTED
Benzodiazepines: NOT DETECTED
Cocaine: NOT DETECTED
Opiates: NOT DETECTED
Tetrahydrocannabinol: NOT DETECTED

## 2018-02-27 LAB — CBC
HEMATOCRIT: 43.2 % (ref 39.0–52.0)
HEMOGLOBIN: 14.9 g/dL (ref 13.0–17.0)
MCH: 32.1 pg (ref 26.0–34.0)
MCHC: 34.5 g/dL (ref 30.0–36.0)
MCV: 93.1 fL (ref 78.0–100.0)
Platelets: 205 10*3/uL (ref 150–400)
RBC: 4.64 MIL/uL (ref 4.22–5.81)
RDW: 12.3 % (ref 11.5–15.5)
WBC: 9 10*3/uL (ref 4.0–10.5)

## 2018-02-27 LAB — SALICYLATE LEVEL: Salicylate Lvl: <7 mg/dL (ref 2.8–30.0)

## 2018-02-27 LAB — ETHANOL: Alcohol, Ethyl (B): <10 mg/dL

## 2018-02-27 LAB — ACETAMINOPHEN LEVEL

## 2018-02-27 MED ORDER — LOPERAMIDE HCL 2 MG PO CAPS
8.0000 mg | ORAL_CAPSULE | Freq: Every morning | ORAL | Status: DC
  Administered 2018-02-28: 8 mg via ORAL
  Filled 2018-02-27 (×2): qty 4

## 2018-02-27 MED ORDER — DULOXETINE HCL 60 MG PO CPEP
60.0000 mg | ORAL_CAPSULE | Freq: Every day | ORAL | Status: DC
  Administered 2018-02-27 – 2018-02-28 (×2): 60 mg via ORAL
  Filled 2018-02-27 (×2): qty 1

## 2018-02-27 MED ORDER — ACETAMINOPHEN 325 MG PO TABS: 650.0000 mg | ORAL_TABLET | ORAL | Status: DC | PRN

## 2018-02-27 MED ORDER — LISDEXAMFETAMINE DIMESYLATE 20 MG PO CAPS
40.0000 mg | ORAL_CAPSULE | Freq: Every day | ORAL | Status: DC
  Administered 2018-02-27 – 2018-02-28 (×2): 40 mg via ORAL
  Filled 2018-02-27 (×2): qty 2

## 2018-02-27 NOTE — ED Provider Notes (Signed)
[facility] EMERGENCY DEPARTMENT Provider Note   CSN: [id number] Arrival date & time: 02/27/18  1429     History   Chief Complaint Chief Complaint  Patient presents with  . Suicidal    HPI Lateef Juncaj is a 47 y.o. male.  Alyas Creary is a 47 y.o. Male with a history of IBS, sleep apnea (uses CPAP nightly),anxiety and depression, who presents voluntarily to the ED for evaluation of suicidal ideations.  Patient reports he was at a marriage counseling appointment with his wife who he is currently in a process of a separation from, and afterwards he started to feel extremely overwhelmed, he reports "he just wants to be done with it all".  He reports the stress of all the separation in this whole process has started to make him feel suicidal with worsening of his depression.  Patient denies plan for suicide.  He denies HI or AVH.  Patient reports history of depression for the past several years, has wanted to harm himself in the past but no history of prior suicide attempts or psychiatric hospitalizations.  He has sought the help of outpatient psychiatry and counseling in the past, is currently on Cymbalta as well as Vyvanse.  He denies any self-injurious behavior, ingestions and reports he has not tried to harm himself today prior to arrival.  He reports smokeless tobacco and occasional cigarette use, as well as occasional alcohol.  Patient denies any other substance use.  History of sleep apnea and IBS, no current exacerbation of his IBS.  He denies any other focal medical complaints, no fevers, chills, cough or URI symptoms, chest pain, shortness of breath, abdominal pain,N/V/D, headaches or vision changes.      Past Medical History:  Diagnosis Date  . Anxiety   . Depression   . IBS (irritable bowel syndrome)   . Sleep apnea     Patient Active Problem List   Diagnosis Date Noted  . Other fatigue 12/10/2017  . Routine general medical examination at a health  care facility 11/30/2016  . OSA on CPAP 05/31/2016  . Generalized anxiety disorder 12/02/2015  . Chronic diarrhea 05/11/2014    History reviewed. No pertinent surgical history.     Home Medications    Prior to Admission medications   Medication Sig Start Date End Date Taking? Authorizing Provider  DULoxetine (CYMBALTA) 60 MG capsule  11/30/17   [provider]  loperamide (IMODIUM) 2 MG capsule Take 2 mg by mouth as needed for diarrhea or loose stools. Takes 4 tablets daily    [provider]  Vitamin D, Ergocalciferol, (DRISDOL) 50000 units CAPS capsule Take 1 capsule (50,000 Units total) by mouth every 7 (seven) days. 12/13/17   Myrlene Broker, MD  VYVANSE 40 MG capsule  10/08/17   [provider]    Family History Family History  Problem Relation Age of Onset  . Diabetes Mother        uncle  . Heart disease Mother        father  . Stomach cancer Mother   . Colon cancer Neg Hx     Social History Social History   Tobacco Use  . Smoking status: Current Every Day Smoker  . Smokeless tobacco: Current User    Types: Snuff  Substance Use Topics  . Alcohol use: Yes    Alcohol/week: 0.0 oz    Comment: 0-3 per day  . Drug use: No     Allergies   Codeine  Review of Systems Review of Systems  Constitutional: Negative for chills and fever.  HENT: Negative for congestion, rhinorrhea and sore throat.   Eyes: Negative for visual disturbance.  Respiratory: Negative for cough and shortness of breath.   Cardiovascular: Negative for chest pain.  Gastrointestinal: Negative for abdominal pain, diarrhea, nausea and vomiting.  Genitourinary: Negative for dysuria.  Musculoskeletal: Negative for arthralgias and myalgias.  Skin: Negative for rash and wound.  Neurological: Negative for dizziness, weakness, numbness and headaches.  Psychiatric/Behavioral: Positive for dysphoric mood and suicidal ideas. Negative for agitation, hallucinations and  self-injury.     Physical Exam Updated Vital Signs BP (!) 144/98 (BP Location: Right Arm)   Pulse 73   Temp 98.3 F (36.8 C) (Oral)   Resp 16   Ht 5\' 9"  (1.753 m)   Wt 74.8 kg (165 lb)   SpO2 99%   BMI 24.37 kg/m   Physical Exam  Constitutional: He appears well-developed and well-nourished. No distress.  HENT:  Head: Normocephalic and atraumatic.  Mouth/Throat: Oropharynx is clear and moist.  Eyes: Right eye exhibits no discharge. Left eye exhibits no discharge.  Cardiovascular: Normal rate, regular rhythm, normal heart sounds and intact distal pulses.  Pulmonary/Chest: Effort normal and breath sounds normal. No stridor. No respiratory distress. He has no wheezes. He has no rales.  Respirations equal and unlabored, patient able to speak in full sentences, lungs clear to auscultation bilaterally  Abdominal: Soft. Bowel sounds are normal. He exhibits no distension and no mass. There is no tenderness. There is no guarding.  Musculoskeletal: He exhibits no edema or deformity.  Neurological: He is alert. Coordination normal.  Skin: Skin is warm and dry. Capillary refill takes less than 2 seconds. He is not diaphoretic.  Psychiatric: His speech is normal. He is slowed and withdrawn. He is not actively hallucinating. Thought content is not paranoid and not delusional. He exhibits a depressed mood. He expresses suicidal ideation. He expresses no homicidal ideation. He expresses no suicidal plans and no homicidal plans.  Nursing note and vitals reviewed.    ED Treatments / Results  Labs (all labs ordered are listed, but only abnormal results are displayed) Labs Reviewed  COMPREHENSIVE METABOLIC PANEL - Abnormal; Notable for the following components:      Result Value   Glucose, Bld 103 (*)    All other components within normal limits  RAPID URINE DRUG SCREEN, HOSP PERFORMED - Abnormal; Notable for the following components:   Amphetamines POSITIVE (*)    All other components within  normal limits  CBC  ETHANOL  SALICYLATE LEVEL  ACETAMINOPHEN LEVEL    EKG  EKG Interpretation None       Radiology No results found.  Procedures Procedures (including critical care time)  Medications Ordered in ED Medications  acetaminophen (TYLENOL) tablet 650 mg (not administered)  DULoxetine (CYMBALTA) DR capsule 60 mg (not administered)  loperamide (IMODIUM) capsule 8 mg (not administered)  lisdexamfetamine (VYVANSE) capsule 40 mg (not administered)     Initial Impression / Assessment and Plan / ED Course  I have reviewed the triage vital signs and the nursing notes.  Pertinent labs & imaging results that were available during my care of the patient were reviewed by me and considered in my medical decision making (see chart for details).  Presents to the ED for evaluation of suicidal ideations, history of depression for the past several years, currently going through separation with his wife which is been a stressor for him, reports SI without  plan, denies HI or AVH.  History of previous SI but no attempts or hospitalizations.  Mildly hypertensive but vitals otherwise normal, no focal medical complaints and no findings on exam.  Medical screening labs collected, no leukocytosis and hemoglobin is normal, glucose mildly elevated at 103, no other electrolyte derangements, normal kidney and liver function, salicylate, acetaminophen and ethanol levels as well as UDS pending.   UDS positive for amphetamines, patient is prescribed and takes Vyvanse daily, otherwise negative.  Acetaminophen, salicylate and ethanol levels are still pending, pending these labs returning normally patient is medically cleared for psychiatric evaluation.  TTS consult has been placed, patient placed under psych hold and home medications restarted.  Awaiting TTS recommendations for appropriate disposition.   Final Clinical Impressions(s) / ED Diagnoses   Final diagnoses:  Suicidal ideation    Depression, unspecified depression type    ED Discharge Orders    None       Legrand Rams 02/27/18 Norwood Levo, MD 02/28/18 0100    Shaune Pollack, MD 02/28/18 [phone]

## 2018-02-27 NOTE — ED Provider Notes (Signed)
.  Patient placed in Quick Look pathway, seen and evaluated   Chief Complaint: S/I  HPI:   47 y.o. male presents to the ED stating he is separated from his wife, who is here with him now, and they were in therapy today when he began to feel suicidal. Patient states he just wants to be done with it all. Patient does not have a specific plan and has not had prior attempts. Patient does have a hx of depression and thoughts of wanting to harm himself in the past.    ROS: Psych: S/I  Physical Exam:  BP (!) 144/98 (BP Location: Right Arm)   Pulse 73   Temp 98.3 F (36.8 C) (Oral)   Resp 16   Ht 5\' 9"  (1.753 m)   Wt 74.8 kg (165 lb)   SpO2 99%   BMI 24.37 kg/m    Gen: NAD  Neuro: Awake and Alert  Psych: appears depressed    Focused Exam:    Initiation of care has begun. The patient has been counseled on the process, plan, and necessity for staying for the completion/evaluation, and the remainder of the medical screening examination    Janne Napoleoneese, Rebekkah Powless M, NP 02/27/18 1451    Shaune PollackIsaacs, Cameron, MD 02/28/18 0100

## 2018-02-27 NOTE — ED Triage Notes (Signed)
Pt reports he and his wife are separated (wife at bedside in triage) and after leaving therapy he started having suicidal thoughts. Pt states "im just done". When asked about plan pt denies having one at this time and denies prior attempts. HE reports hx of depression and having thoughts of wanting to harm himself in the past.

## 2018-02-27 NOTE — BH Assessment (Addendum)
Tele Assessment Note   Patient Name: Gregory AlbertsStephen Vaughan MRN: [id number] Referring Physician: Legrand RamsFord, Kelsey N, PA-C Location of Patient: [facility] Location of Provider: Behavioral Health TTS Department  Gregory Vaughan is an 47 y.o. male who presents to the ED voluntarily. Pt reports he was attending a marital counseling session today and became overwhelmed with emotion and told his therapist he wanted to end his life. Pt states he is stressed due to his wife separating, financial stressors, and pt is currently living with a friend due to the separation. Pt states he feels hopeless and suicidal. Pt denies that he has an actual plan at this time. Pt denies any prior suicide attempts but states he has been receiving OPT at [facility] due to depression and anxiety. Pt states he has 46 year old twins that still live at home with his estranged wife. Pt reports difficulty sleeping, loss of appetite, and overall feelings of worthlessness. Pt denies any prior inpt admission but states he is willing to sign voluntarily to receive inpt treatment.   Case discussed with Nira ConnJason Berry, NP who recommends inpt admission. [facility] TTS advised the pt's nurse Lorin PicketScott, RN and the EDP Dartha LodgeFord, Kelsey N, PA-C of the disposition.    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 Anxiety   . Depression   . IBS (irritable bowel syndrome)   . Sleep apnea     History reviewed. No pertinent surgical history.  Family History:  Family History  Problem Relation Age of Onset  . Diabetes Mother        uncle  . Heart disease Mother        father  . Stomach cancer Mother   . Colon cancer Neg Hx     Social History:  reports that he has been smoking.  His smokeless tobacco use includes snuff. He reports that he drinks alcohol. He reports that he does not use drugs.  Additional Social History:   Alcohol / Drug Use Pain Medications: See MAR Prescriptions: See MAR Over the Counter: See MAR History of alcohol / drug use?: No history of alcohol / drug abuse  CIWA: CIWA-Ar BP: (!) 144/98 Pulse Rate: 73 COWS:    Allergies:  Allergies  Allergen Reactions  . Codeine Hives  . Other Diarrhea    Romaine and Iceberg lettuce    Home Medications:  (Not in a hospital admission)  OB/GYN Status:  No LMP for male patient.  General Assessment Data [facility] Assessment: In system Is this a Tele or Face-to-Face Assessment?: Tele Assessment Is this an Initial Assessment or a Re-assessment for this encounter?: Initial Assessment Marital status: Separated Is patient pregnant?: No Pregnancy Status: No Living Arrangements: Non-relatives/Friends Can pt return to current living arrangement?: Yes Admission Status: Voluntary Is patient capable of signing voluntary admission?: Yes Referral Source: Self/Family/Friend [facility]     Crisis Care Plan Living Arrangements: Non-relatives/Friends Name of Psychiatrist: Dr. Delfin GantAmy Stevens, MD Name of Therapist: Corie ChiquitoJessica Carter, Jake SharkHarold   Education Status Is patient currently in school?: No Is the patient employed, unemployed or receiving disability?: Employed  Risk to self with the past 6 months Suicidal Ideation: Yes-Currently Present Has patient been a risk to self within the past 6 months prior to admission? : No Suicidal Intent: No Has patient had any suicidal intent within the past 6 months prior to admission? :  No Is patient at risk for suicide?: Yes Suicidal Plan?: No Has patient had any suicidal plan within the past 6 months prior to admission? : No Access to Means: No What has been your use of drugs/alcohol within the last 12 months?: reports to withdrawing from chewing tobacco  Previous Attempts/Gestures: No Triggers for Past Attempts: None known Intentional Self Injurious Behavior: None Family  Suicide History: No Recent stressful life event(s): Conflict (Comment), Divorce, Financial Problems Persecutory voices/beliefs?: No Depression: Yes Depression Symptoms: Despondent, Insomnia, Tearfulness, Isolating, Fatigue, Guilt, Loss of interest in usual pleasures, Feeling worthless/self pity, Feeling angry/irritable Substance abuse history and/or treatment for substance abuse?: No Suicide prevention information given to non-admitted patients: Not applicable  Risk to Others within the past 6 months Homicidal Ideation: No Does patient have any lifetime risk of violence toward others beyond the six months prior to admission? : No Thoughts of Harm to Others: No Current Homicidal Intent: No Current Homicidal Plan: No Access to Homicidal Means: No History of harm to others?: No Assessment of Violence: None Noted Does patient have access to weapons?: No Criminal Charges Pending?: No Does patient have a court date: No Is patient on probation?: No  Psychosis Hallucinations: None noted Delusions: None noted  Mental Status Report Appearance/Hygiene: In scrubs, Unremarkable Eye Contact: Good Motor Activity: Freedom of movement Speech: Logical/coherent Level of Consciousness: Alert Mood: Depressed, Anxious, Helpless, Sad Affect: Depressed, Anxious, Sad Anxiety Level: Severe Thought Processes: Relevant, Coherent Judgement: Impaired Orientation: Person, Place, Situation, Time, Appropriate for developmental age Obsessive Compulsive Thoughts/Behaviors: None  Cognitive Functioning Concentration: Normal Memory: Remote Intact, Recent Intact Is patient IDD: No Is patient DD?: No Insight: Poor Impulse Control: Poor Appetite: Fair Have you had any weight changes? : No Change Sleep: Decreased Total Hours of Sleep: 6 Vegetative Symptoms: None  ADLScreening [facility] Assessment Services) Patient's cognitive ability adequate to safely complete daily activities?: Yes Patient able to express  need for assistance with ADLs?: Yes Independently performs ADLs?: Yes (appropriate for developmental age)  Prior Inpatient Therapy Prior Inpatient Therapy: No  Prior Outpatient Therapy Prior Outpatient Therapy: Yes Prior Therapy Dates: current Prior Therapy Facilty/Provider(s): Marriage counseling, [facility] Reason for Treatment: Marital counseling, anxiety, depression  Does patient have an ACCT team?: No Does patient have Intensive In-House Services?  : No Does patient have [facility] services? : No Does patient have P4CC services?: No  ADL Screening (condition at time of admission) Patient's cognitive ability adequate to safely complete daily activities?: Yes Is the patient deaf or have difficulty hearing?: No Does the patient have difficulty seeing, even when wearing glasses/contacts?: No Does the patient have difficulty concentrating, remembering, or making decisions?: No Patient able to express need for assistance with ADLs?: Yes Does the patient have difficulty dressing or bathing?: No Independently performs ADLs?: Yes (appropriate for developmental age) Does the patient have difficulty walking or climbing stairs?: No Weakness of Legs: None Weakness of Arms/Hands: None  Home Assistive Devices/Equipment Home Assistive Devices/Equipment: Eyeglasses    Abuse/Neglect Assessment (Assessment to be complete while patient is alone) Abuse/Neglect Assessment Can Be Completed: Yes Physical Abuse: Denies Verbal Abuse: Denies Sexual Abuse: Denies Exploitation of patient/patient's resources: Denies Self-Neglect: Denies     Merchant navy officer (For Healthcare) Does Patient Have a Medical Advance Directive?: No Would patient like information on creating a medical advance directive?: No - Patient declined    Additional Information 1:1 In Past 12 Months?: No CIRT Risk: No Elopement Risk: No Does patient have medical clearance?: Yes  Disposition: Case discussed with Nira Conn, NP who recommends inpt admission. [facility] currently reviewing for possible admission per [facility]. TTS advised the pt's nurse Lorin Picket, RN and the EDP Dartha Lodge, PA-C of the disposition.    Disposition Initial Assessment Completed for this Encounter: Yes Disposition of Patient: Admit Type of inpatient treatment program: Adult(per Nira Conn, NP) Patient refused recommended treatment: No  This service was provided via telemedicine using a 2-way, interactive audio and video technology.  Names of all persons participating in this telemedicine service and their role in this encounter. Name: Reason Helzer Role: Patient   Name: Princess Bruins Role: TTS          Karolee Ohs 02/27/2018 9:45 PM

## 2018-02-28 ENCOUNTER — Encounter (HOSPITAL_COMMUNITY): Payer: Self-pay | Admitting: *Deleted

## 2018-02-28 ENCOUNTER — Telehealth: Payer: Self-pay | Admitting: *Deleted

## 2018-02-28 ENCOUNTER — Inpatient Hospital Stay (HOSPITAL_COMMUNITY)
Admission: AD | Admit: 2018-02-28 | Discharge: 2018-03-03 | DRG: 885 | Disposition: A | Discharge status: Home / Self Care | Payer: 59 | Source: Intra-hospital | Attending: Psychiatry | Admitting: Psychiatry

## 2018-02-28 ENCOUNTER — Other Ambulatory Visit: Payer: Self-pay

## 2018-02-28 DIAGNOSIS — F1010 Alcohol abuse, uncomplicated: Secondary | ICD-10-CM | POA: Diagnosis present

## 2018-02-28 DIAGNOSIS — Z91018 Allergy to other foods: Secondary | ICD-10-CM | POA: Diagnosis not present

## 2018-02-28 DIAGNOSIS — Z811 Family history of alcohol abuse and dependence: Secondary | ICD-10-CM | POA: Diagnosis not present

## 2018-02-28 DIAGNOSIS — Z885 Allergy status to narcotic agent status: Secondary | ICD-10-CM | POA: Diagnosis not present

## 2018-02-28 DIAGNOSIS — F419 Anxiety disorder, unspecified: Secondary | ICD-10-CM | POA: Diagnosis present

## 2018-02-28 DIAGNOSIS — F528 Other sexual dysfunction not due to a substance or known physiological condition: Secondary | ICD-10-CM | POA: Diagnosis present

## 2018-02-28 DIAGNOSIS — F909 Attention-deficit hyperactivity disorder, unspecified type: Secondary | ICD-10-CM | POA: Diagnosis present

## 2018-02-28 DIAGNOSIS — F1099 Alcohol use, unspecified with unspecified alcohol-induced disorder: Secondary | ICD-10-CM | POA: Diagnosis not present

## 2018-02-28 DIAGNOSIS — F332 Major depressive disorder, recurrent severe without psychotic features: Secondary | ICD-10-CM | POA: Diagnosis present

## 2018-02-28 DIAGNOSIS — F1014 Alcohol abuse with alcohol-induced mood disorder: Secondary | ICD-10-CM | POA: Diagnosis not present

## 2018-02-28 DIAGNOSIS — G4700 Insomnia, unspecified: Secondary | ICD-10-CM | POA: Diagnosis present

## 2018-02-28 DIAGNOSIS — Z79899 Other long term (current) drug therapy: Secondary | ICD-10-CM | POA: Diagnosis not present

## 2018-02-28 DIAGNOSIS — Z818 Family history of other mental and behavioral disorders: Secondary | ICD-10-CM

## 2018-02-28 DIAGNOSIS — F329 Major depressive disorder, single episode, unspecified: Secondary | ICD-10-CM | POA: Diagnosis not present

## 2018-02-28 DIAGNOSIS — K589 Irritable bowel syndrome without diarrhea: Secondary | ICD-10-CM | POA: Diagnosis present

## 2018-02-28 DIAGNOSIS — R450 Nervousness: Secondary | ICD-10-CM | POA: Diagnosis not present

## 2018-02-28 DIAGNOSIS — R45851 Suicidal ideations: Secondary | ICD-10-CM | POA: Diagnosis present

## 2018-02-28 MED ORDER — ACETAMINOPHEN 325 MG PO TABS: 650.0000 mg | ORAL_TABLET | Freq: Four times a day (QID) | ORAL | Status: DC | PRN

## 2018-02-28 MED ORDER — CITALOPRAM HYDROBROMIDE 10 MG PO TABS
10.0000 mg | ORAL_TABLET | Freq: Every day | ORAL | Status: DC
  Administered 2018-03-01 – 2018-03-03 (×3): 10 mg via ORAL
  Filled 2018-02-28 (×5): qty 1

## 2018-02-28 MED ORDER — HYDROXYZINE HCL 25 MG PO TABS
25.0000 mg | ORAL_TABLET | Freq: Three times a day (TID) | ORAL | Status: DC | PRN
  Administered 2018-03-02: 25 mg via ORAL
  Filled 2018-02-28: qty 1

## 2018-02-28 MED ORDER — FOLIC ACID 1 MG PO TABS
1.0000 mg | ORAL_TABLET | Freq: Every day | ORAL | Status: DC
  Administered 2018-02-28 – 2018-03-03 (×4): 1 mg via ORAL
  Filled 2018-02-28 (×7): qty 1

## 2018-02-28 MED ORDER — VITAMIN B-1 100 MG PO TABS
100.0000 mg | ORAL_TABLET | Freq: Every day | ORAL | Status: DC
  Administered 2018-02-28 – 2018-03-03 (×4): 100 mg via ORAL
  Filled 2018-02-28 (×7): qty 1

## 2018-02-28 MED ORDER — ALUM & MAG HYDROXIDE-SIMETH 200-200-20 MG/5ML PO SUSP: 30.0000 mL | ORAL | Status: DC | PRN

## 2018-02-28 MED ORDER — ZOLPIDEM TARTRATE 5 MG PO TABS
5.0000 mg | ORAL_TABLET | Freq: Once | ORAL | Status: AC
  Administered 2018-02-28: 5 mg via ORAL
  Filled 2018-02-28: qty 1

## 2018-02-28 MED ORDER — NICOTINE 21 MG/24HR TD PT24
21.0000 mg | MEDICATED_PATCH | Freq: Every day | TRANSDERMAL | Status: DC
  Administered 2018-02-28 – 2018-03-03 (×4): 21 mg via TRANSDERMAL
  Filled 2018-02-28 (×8): qty 1

## 2018-02-28 MED ORDER — CHLORDIAZEPOXIDE HCL 25 MG PO CAPS: 25.0000 mg | ORAL_CAPSULE | Freq: Four times a day (QID) | ORAL | Status: DC | PRN

## 2018-02-28 MED ORDER — TRAZODONE HCL 50 MG PO TABS
50.0000 mg | ORAL_TABLET | Freq: Every evening | ORAL | Status: DC | PRN
  Administered 2018-02-28 – 2018-03-02 (×3): 50 mg via ORAL
  Filled 2018-02-28 (×3): qty 1

## 2018-02-28 MED ORDER — MAGNESIUM HYDROXIDE 400 MG/5ML PO SUSP: 30.0000 mL | Freq: Every day | ORAL | Status: DC | PRN

## 2018-02-28 NOTE — [facility] Suicide Risk Assessment (Signed)
[facility]   Nursing information obtained from:   patient and chart  Demographic factors:   47 year old married male, employed  Current Mental Status:    see below  Loss Factors:   marital issues, states wife has requested separation, stressful job  Historical Factors:   history of depression, history of alcohol abuse , denies prior psychiatric admissions Risk Reduction Factors:   resilience, employment  Total Time spent with patient: 45 minutes Principal Problem:  MDD, no psychotic features  Diagnosis:   Patient Active Problem List   Diagnosis Date Noted  . Severe recurrent major depression without psychotic features (HCC) [F33.2] 02/28/2018  . Other fatigue [R53.83] 12/10/2017  . Routine general medical examination at a health care facility [Z00.00] 11/30/2016  . OSA on CPAP [G47.33, Z99.89] 05/31/2016  . Generalized anxiety disorder [F41.1] 12/02/2015  . Chronic diarrhea [K52.9] 05/11/2014    Continued Clinical Symptoms:    The "Alcohol Use Disorders Identification Test", Guidelines for Use in Primary Care, Second Edition.  World Science writerHealth Organization [facility](WHO). Score between 0-7:  no or low risk or alcohol related problems. Score between 8-15:  moderate risk of alcohol related problems. Score between 16-19:  high risk of alcohol related problems. Score 20 or above:  warrants further diagnostic evaluation for alcohol dependence and treatment.   CLINICAL FACTORS:   47 year old married male, presented to ED voluntarily yesterday following a marital counseling  session with his wife - states wife recently requested separation. Reported depression, anxiety, passive SI. Reports history of depression, which has worsened recently in the context of above stressors. Of note, reports history of alcohol use disorder, and states he has a long history of daily drinking, at about 6 beers per day, but states he cut down significantly over the last 2 weeks. No current WDL  symptoms noted or endorsed .   Psychiatric Specialty Exam: Physical Exam  ROS  Blood pressure 130/79, temperature 99.2 F (37.3 C), resp. rate 16, height 5\' 9"  (1.753 m), weight 73.9 kg (163 lb), SpO2 98 %.Body mass index is 24.07 kg/m.  See admit note MSE   COGNITIVE FEATURES THAT CONTRIBUTE TO RISK:  Closed-mindedness and Loss of executive function    SUICIDE RISK:   Moderate:  Frequent suicidal ideation with limited intensity, and duration, some specificity in terms of plans, no associated intent, good self-control, limited dysphoria/symptomatology, some risk factors present, and identifiable protective factors, including available and accessible social support.  PLAN OF CARE: Patient will be admitted to inpatient psychiatric unit for stabilization and safety. Will provide and encourage milieu participation. Provide medication management and maked adjustments as needed.  Will also provide medication to minimize risk of alcohol WDL. Will follow daily.    I certify that inpatient services furnished can reasonably be expected to improve the patient's condition.   Craige CottaFernando A Cobos, MD 02/28/2018, 5:08 PM

## 2018-02-28 NOTE — Progress Notes (Signed)
Adult Psychoeducational Group Note  Date:  02/28/2018 Time:  9:16 PM  Group Topic/Focus:  Wrap-Up Group:   The focus of this group is to help patients review their daily goal of treatment and discuss progress on daily workbooks.  Participation Level:  Active  Participation Quality:  Appropriate  Affect:  Appropriate  Cognitive:  Alert and Oriented  Insight: Good  Engagement in Group:  Engaged  Modes of Intervention:  Discussion  Additional Comments:  Pt stated his day started as a 1/10 but went to a 5/10. Pt said it is his first day, so he didn't have a goal. He's just trying to get adjusted. Pt has been reading to pass the time and to distract him.  Leo GrosserMegan A Pawan Knechtel 02/28/2018, 9:16 PM

## 2018-02-28 NOTE — [facility] Suicide Risk Assessment (Signed)
[facility] INPATIENT:  Family/Significant Other Suicide Prevention Education  Suicide Prevention Education:   Patient Refusal for Family/Significant Other Suicide Prevention Education: The patient Gregory Vaughan has refused to provide written consent for family/significant other to be provided Family/Significant Other Suicide Prevention Education during admission and/or prior to discharge.  Physician notified.  Gregory Vaughan 02/28/2018, 2:58 PM

## 2018-02-28 NOTE — BHH Group Notes (Signed)
  [facility] LCSW Group Therapy Note  Date/Time: 02/28/18, 1315  Type of Therapy/Topic:  Group Therapy:  Emotion Regulation  Participation Level:  Active   Mood:pleasant  Description of Group:    The purpose of this group is to assist patients in learning to regulate negative emotions and experience positive emotions. Patients will be guided to discuss ways in which they have been vulnerable to their negative emotions. These vulnerabilities will be juxtaposed with experiences of positive emotions or situations, and patients challenged to use positive emotions to combat negative ones. Special emphasis will be placed on coping with negative emotions in conflict situations, and patients will process healthy conflict resolution skills.  Therapeutic Goals: 1. Patient will identify two positive emotions or experiences to reflect on in order to balance out negative emotions:  2. Patient will label two or more emotions that they find the most difficult to experience:  3. Patient will be able to demonstrate positive conflict resolution skills through discussion or role plays:   Summary of Patient Progress: Pt shared that anxiety is an emotion that is difficult for him to experience.  Pt participated in group discussion about positive ways to deal with negative emotions. Pt was pulled out of group to meet with MD but came back and was again active in the discussion.         Therapeutic Modalities:   Cognitive Behavioral Therapy Feelings Identification Dialectical Behavioral Therapy  Daleen Squibb, LCSW

## 2018-02-28 NOTE — Progress Notes (Addendum)
Patient is a 47 yo caucasian male who is [facility]- stressor being the break up of his 5110 marriage with his wife after 1 yr intensive marriage counseling. 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 Today he is able to contract with this Clinical research associatewriter for safety, he is oriented to the unit and admission completed.

## 2018-02-28 NOTE — H&P (Addendum)
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 Severe recurrent major depression without psychotic features ([place]) [F33.2] 02/28/2018  . Other fatigue [R53.83] 12/10/2017  . Routine general medical examination at a health care facility [Z00.00] 11/30/2016  . OSA on CPAP [G47.33, Z99.89] 05/31/2016  . Generalized anxiety disorder [F41.1] 12/02/2015  . Chronic diarrhea [K52.9] 05/11/2014   History of Present Illness: 47 year old married male, presented to ED yesterday voluntarily due to feeling depressed and suicidal , but without specific plan or intention. He had been in a couples therapy session earlier in the day , and states that his wife recently announced her wanting to separate. He also reports work as stressful, with recent organizational changes that have been overwhelming at times. He reports his depression has been chronic, going back to grief over his mother's death in March 15, 2008. Describes depression as intermittent, with a waxing and waning course  Of note, reports he had been drinking daily, up to 6 beers per day, but states he had " slowed down a lot " over the last 2-3 weeks, at the recommendation of marriage counselor. Denies having had any alcohol WDL symptoms. Admission UDS positive for Amphetamines ( patient reports he is prescribed Vyvanse ) . Admission BAL <7.0 Endorses neuro -vegetative symptoms as below. Denies psychotic symptoms.  Associated Signs/Symptoms: Depression Symptoms:  depressed mood, anhedonia, insomnia, feelings of worthlessness/guilt, hopelessness, suicidal thoughts without plan, anxiety, loss of energy/fatigue, decreased appetite, (Hypo) Manic  Symptoms: reports history of impulsivity, but states " it is just my personality", does not endorse symptoms of mania.  Anxiety Symptoms:  Describes history of worrying excessively, denies panic attacks  Psychotic Symptoms:  Denies  PTSD Symptoms: Denies  Total Time spent with patient: 45 minutes  Past Psychiatric History: No depression, no previous psychiatric hospitalizations, has never attempted suicide, no history of self injurious behaviors, no history of psychosis, denies history of mania , history of depression. Reports history of anxiety, described mostly as tendency to worry.  States he has been diagnosed with ADHD  Is the patient at risk to self? Yes.    Has the patient been a risk to self in the past 6 months? No.  Has the patient been a risk to self within the distant past? No.  Is the patient a risk to others? No.  Has the patient been a risk to others in the past 6 months? No.  Has the patient been a risk to others within the distant past? No.   Prior Inpatient Therapy:  Denies Prior Outpatient Therapy:  Follows up at [facility]   Alcohol Screening:   Substance Abuse History in the last 12 months:  Reports history of alcohol use disorder ,states he had been drinking up to 6 beers per day , up to 2 weeks ago , when he decreased intake significantly  Consequences of Substance Abuse: Acknowledges his drinking has been a contributor to marital difficulties , denies history of DUI, denies history of severe WDL or of WDL seizures, denies history of DTs.  Previous Psychotropic Medications: Cymbalta 60 mgrs QDAY ( x 2 years), Vyvanse 40 mgrs QDAY ( since 05/2017). No other psychiatric medication trials .  Psychological Evaluations:  No  Past Medical History:  History of IBS.  Past Medical History:  Diagnosis Date  . Anxiety   . Depression   . IBS (irritable bowel syndrome)   . Sleep apnea    No past surgical history on file. Family History: mother is deceased , died  Feb 20, 2008 from cancer, father is alive . One younger sister. Family History  Problem Relation Age of Onset  . Diabetes Mother        uncle  . Heart disease Mother        father  . Stomach cancer Mother   . Colon cancer Neg Hx    Family Psychiatric History: Mother had history of depression, father has history of alcohol abuse . No history of suicide in family.  Tobacco Screening:  uses " snuff" tobacco.  Social History: 47 year old married male, has 46 year old twins, employed .  Social History   Substance and Sexual Activity  Alcohol Use Yes  . Alcohol/week: 0.0 oz   Comment: 0-3 per day     Social History   Substance and Sexual Activity  Drug Use No    Additional Social History:  Allergies:   Allergies  Allergen Reactions  . Codeine Hives  . Other Diarrhea    Romaine and Iceberg lettuce   Lab Results:  Results for orders placed or performed during the hospital encounter of 02/27/18 (from the past 48 hour(s))  Comprehensive metabolic panel     Status: Abnormal   Collection Time: 02/27/18  3:13 PM  Result Value Ref Range   Sodium 138 135 - 145 mmol/L   Potassium 4.6 3.5 - 5.1 mmol/L   Chloride 101 101 - 111 mmol/L   CO2 25 22 - 32 mmol/L   Glucose, Bld 103 (H) 65 - 99 mg/dL   BUN 6 6 - 20 mg/dL   Creatinine, Ser 1.05 0.61 - 1.24 mg/dL   Calcium 9.5 8.9 - 10.3 mg/dL   Total Protein 7.0 6.5 - 8.1 g/dL   Albumin 4.3 3.5 - 5.0 g/dL   AST 26 15 - 41 U/L   ALT 27 17 - 63 U/L   Alkaline Phosphatase 57 38 - 126 U/L   Total Bilirubin 0.9 0.3 - 1.2 mg/dL   GFR calc non Af Amer >60 >60 mL/min   GFR calc Af Amer >60 >60 mL/min    Comment: (NOTE) The eGFR has been calculated using the CKD EPI equation. This calculation has not been validated in all clinical situations. eGFR's persistently <60 mL/min signify possible Chronic Kidney Disease.    Anion gap 12 5 - 15    Comment: Performed at [facility] [address] [place], [place] [address]  cbc     Status: None    Collection Time: 02/27/18  3:13 PM  Result Value Ref Range   WBC 9.0 4.0 - 10.5 K/uL   RBC 4.64 4.22 - 5.81 MIL/uL   Hemoglobin 14.9 13.0 - 17.0 g/dL   HCT 43.2 39.0 - 52.0 %   MCV 93.1 78.0 - 100.0 fL   MCH 32.1 26.0 - 34.0 pg   MCHC 34.5 30.0 - 36.0 g/dL   RDW 12.3 11.5 - 15.5 %   Platelets 205 150 - 400 K/uL    Comment: Performed at [facility] [address] [place], [place] [address]  Rapid urine drug screen (hospital performed)     Status: Abnormal   Collection Time: 02/27/18  5:23 PM  Result Value Ref Range  Opiates NONE DETECTED NONE DETECTED   Cocaine NONE DETECTED NONE DETECTED   Benzodiazepines NONE DETECTED NONE DETECTED   Amphetamines POSITIVE (A) NONE DETECTED   Tetrahydrocannabinol NONE DETECTED NONE DETECTED   Barbiturates NONE DETECTED NONE DETECTED    Comment: (NOTE) DRUG SCREEN FOR MEDICAL PURPOSES ONLY.  IF CONFIRMATION IS NEEDED FOR ANY PURPOSE, NOTIFY LAB WITHIN 5 DAYS. LOWEST DETECTABLE LIMITS FOR URINE DRUG SCREEN Drug Class                     Cutoff (ng/mL) Amphetamine and metabolites    1000 Barbiturate and metabolites    200 Benzodiazepine                 694 Tricyclics and metabolites     300 Opiates and metabolites        300 Cocaine and metabolites        300 THC                            50 Performed at [facility], [place] [address] [place], [place] [address]   Ethanol     Status: None   Collection Time: 02/27/18  8:20 PM  Result Value Ref Range   Alcohol, Ethyl (B) <10 <10 mg/dL    Comment:        LOWEST DETECTABLE LIMIT FOR SERUM ALCOHOL IS 10 mg/dL FOR MEDICAL PURPOSES ONLY Performed at [facility], [place] [address] [place], [place] [address]   Salicylate level     Status: None   Collection Time: 02/27/18  8:20 PM  Result Value Ref Range   Salicylate Lvl <0.9 2.8 - 30.0 mg/dL    Comment: Performed at [facility] [address] [place], [place] [address]  Acetaminophen level     Status: Abnormal    Collection Time: 02/27/18  8:20 PM  Result Value Ref Range   Acetaminophen (Tylenol), Serum <10 (L) 10 - 30 ug/mL    Comment:        THERAPEUTIC CONCENTRATIONS VARY SIGNIFICANTLY. A RANGE OF 10-30 ug/mL MAY BE AN EFFECTIVE CONCENTRATION FOR MANY PATIENTS. HOWEVER, SOME ARE BEST TREATED AT CONCENTRATIONS OUTSIDE THIS RANGE. ACETAMINOPHEN CONCENTRATIONS >150 ug/mL AT 4 HOURS AFTER INGESTION AND >50 ug/mL AT 12 HOURS AFTER INGESTION ARE OFTEN ASSOCIATED WITH TOXIC REACTIONS. Performed at [facility], [place] [address], [place], [place] [address]     Blood Alcohol level:  Lab Results  Component Value Date   ETH <10 69/67/8938    Metabolic Disorder Labs:  Lab Results  Component Value Date   HGBA1C 5.5 12/10/2017   No results found for: PROLACTIN Lab Results  Component Value Date   CHOL 145 12/10/2017   TRIG 132.0 12/10/2017   HDL 64.90 12/10/2017   CHOLHDL 2 12/10/2017   VLDL 26.4 12/10/2017   LDLCALC 53 12/10/2017   LDLCALC 88 11/30/2016    Current Medications: Current Facility-Administered Medications  Medication Dose Route Frequency Provider Last Rate Last Dose  . acetaminophen (TYLENOL) tablet 650 mg  650 mg Oral Q6H PRN Lindon Romp A, NP      . alum & mag hydroxide-simeth (MAALOX/MYLANTA) 200-200-20 MG/5ML suspension 30 mL  30 mL Oral Q4H PRN Lindon Romp A, NP      . hydrOXYzine (ATARAX/VISTARIL) tablet 25 mg  25 mg Oral TID PRN Lindon Romp A, NP      . magnesium hydroxide (MILK OF  MAGNESIA) suspension 30 mL  30 mL Oral Daily PRN Lindon Romp A, NP      . nicotine (NICODERM CQ - dosed in mg/24 hours) patch 21 mg  21 mg Transdermal Daily Cobos, Myer Peer, MD   21 mg at 02/28/18 1207  . traZODone (DESYREL) tablet 50 mg  50 mg Oral QHS PRN Rozetta Nunnery, NP       PTA Medications: Medications Prior to Admission  Medication Sig Dispense Refill Last Dose  . Cyanocobalamin (B-12 COMPLIANCE INJECTION) 1000 MCG/ML KIT Inject 1,000 mcg as directed every 7  (seven) days.   Past Week at Unknown time  . DULoxetine (CYMBALTA) 60 MG capsule Take 60 mg by mouth daily.    02/27/2018 at am  . ibuprofen (ADVIL,MOTRIN) 200 MG tablet Take 400-800 mg by mouth every 6 (six) hours as needed (for pain or headaches).   Past Week at Unknown time  . loperamide (IMODIUM) 2 MG capsule Take 8 mg by mouth every morning.    02/27/2018 at am  . Vitamin D, Ergocalciferol, (DRISDOL) 50000 units CAPS capsule Take 1 capsule (50,000 Units total) by mouth every 7 (seven) days. (Patient taking differently: Take 50,000 Units by mouth every Saturday. ) 12 capsule 0 02/22/2018 at Unknown time  . VYVANSE 40 MG capsule Take 40 mg by mouth daily.    02/27/2018 at am    Musculoskeletal: Strength & Muscle Tone: within normal limits Gait & Station: normal Patient leans: N/A  Psychiatric Specialty Exam: Physical Exam  Review of Systems  Constitutional: Negative.   HENT: Negative.   Eyes: Negative.   Respiratory: Negative.   Cardiovascular: Negative.   Gastrointestinal: Negative.   Genitourinary: Negative.   Musculoskeletal: Negative.   Skin: Negative.   Neurological: Negative for dizziness, seizures and headaches.  Endo/Heme/Allergies: Negative.   Psychiatric/Behavioral: Positive for depression and suicidal ideas.    There were no vitals taken for this visit.There is no height or weight on file to calculate BMI.  General Appearance: Fairly Groomed  Eye Contact:  Good  Speech:  Normal Rate  Volume:  Normal  Mood:  Anxious and Depressed  Affect:  Constricted and vaguely anxious   Thought Process:  Linear and Descriptions of Associations: Intact  Orientation:  Full (Time, Place, and Person)  Thought Content:  no hallucinations, no delusions, not internally preoccupied   Suicidal Thoughts:  No denies any suicidal ideations , denies self injurious ideations,contracts for safety on unit, denies homicidal or violent ideations, and also specifically denies any violent ideations towards  his wife   Homicidal Thoughts:  No  Memory:  recent and remote grossly intact   Judgement:  Fair  Insight:  Fair  Psychomotor Activity:  Normal- no tremors, no diaphoresis, no restlessness or agitation   Concentration:  Concentration: Good and Attention Span: Good  Recall:  Good  Fund of Knowledge:  Good  Language:  Good  Akathisia:  Negative  Handed:  Right  AIMS (if indicated):     Assets:  Communication Skills Desire for Improvement Resilience  ADL's:  Intact  Cognition:  WNL  Sleep:         Treatment Plan Summary: Daily contact with patient to assess and evaluate symptoms and progress in treatment, Medication management, Plan inpatient treatment  and medications as below  Observation Level/Precautions:  15 minute checks Laboratory:  as needed  Psychotherapy:  Milieu, group therapy  Medications:  We discussed options, states he does not think Cymbalta has been helping. Agrees to [employer]  trial. Stat Celexa 10 mgrs QDAY initially . Will start Librium PRN for potential alcohol WDL.  Consultations:  As needed  Discharge Concerns:  -  Estimated LOS: 4-5 days  Other:      Neita Garnet, MD

## 2018-02-28 NOTE — ED Notes (Signed)
Attempted report to BH 

## 2018-02-28 NOTE — Progress Notes (Signed)
Patient ID: Gregory Vaughan, male   DOB: 01-27-1971, 47 y.o.   MRN: [id number]  D: Patient in dayroom watching TV and interacting with peers. Pt reports he is doing well. Pt denies any withdrawal symptoms. Pt mood and affect appeared anxious. Pt reports he is tolerating medication well. Pt denies SI/HI/AVH and pain. Pt attended and participated in evening wrap up group. Cooperative with assessment. No acute distressed noted at this time.   A: Medications administered as prescribed. Support and encouragement provided to attend groups and engage in milieu. Pt encouraged to discuss feelings and come to staff with any question or concerns.   R: Patient remains safe and complaint with medications.

## 2018-02-28 NOTE — ED Notes (Signed)
Patient family at bedside,

## 2018-02-28 NOTE — Telephone Encounter (Signed)
Pt was on TCM report went to ER 02/27/18 voluntary for evaluation of suicidal ideations. [facility]../lmb

## 2018-02-28 NOTE — Progress Notes (Signed)
Psychoeducational Group Note  Date:  02/28/2018 Time:  1000  Group Topic/Focus:  Identifying Needs:   The focus of this group is to help patients identify their personal needs that have been historically problematic and identify healthy behaviors to address their needs.  Participation Level: Did Not Attend  Participation Quality:  Not Applicable  Affect:  Not Applicable  Cognitive:  Not Applicable  Insight:  Not Applicable  Engagement in Group: Not Applicable  Additional Comments:  Group focus on vulnerability   Gregory Vaughan, Gregory Vaughan Patience 02/28/2018, 2:00 PM

## 2018-02-28 NOTE — Progress Notes (Signed)
[facility]. Attending provider will be Dr. Jama Flavorsobos, MD. Call to report [phone]. Pt's nurse Lorin PicketScott, RN advised of acceptance and states the pt was recently given medication and will be transported in the AM after 07:30.  Princess BruinsAquicha Lizania Bouchard, MSW, LCSW Therapeutic Triage Specialist  [phone]
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
## 2018-02-28 NOTE — ED Notes (Signed)
Phelm Called

## 2018-02-28 NOTE — Tx Team (Signed)
Initial Treatment Plan 02/28/2018 12:36 PM Gregory AlbertsStephen Gallop [id number]:[id number]    PATIENT STRESSORS: Educational concerns Financial difficulties   PATIENT STRENGTHS: Ability for insight Active sense of humor Average or above average intelligence   PATIENT IDENTIFIED PROBLEMS: Depression with SI  Alcoholism  IBS            " I feel better today"  " I want to feel better"   DISCHARGE CRITERIA:  Ability to meet basic life and health needs Adequate post-discharge living arrangements Medical problems require only outpatient monitoring  PRELIMINARY DISCHARGE PLAN: Attend aftercare/continuing care group Attend PHP/IOP  PATIENT/FAMILY INVOLVEMENT: This treatment plan has been presented to and reviewed with the patient, Gregory AlbertsStephen Klaus, and/or family member, .  The patient and family have been given the opportunity to ask questions and make suggestions.  Rich Braveuke, Ridhi Hoffert Lynn, RN 02/28/2018, 12:36 PM

## 2018-03-01 DIAGNOSIS — G4700 Insomnia, unspecified: Secondary | ICD-10-CM

## 2018-03-01 DIAGNOSIS — R450 Nervousness: Secondary | ICD-10-CM

## 2018-03-01 DIAGNOSIS — F1099 Alcohol use, unspecified with unspecified alcohol-induced disorder: Secondary | ICD-10-CM

## 2018-03-01 DIAGNOSIS — F419 Anxiety disorder, unspecified: Secondary | ICD-10-CM

## 2018-03-01 DIAGNOSIS — F1010 Alcohol abuse, uncomplicated: Secondary | ICD-10-CM | POA: Diagnosis present

## 2018-03-01 NOTE — Progress Notes (Addendum)
[facility] MD Progress Note  03/01/2018 12:21 PM Gregory Vaughan  MRN:  [id number]   Subjective:  Patient reports that he is feeling much better today and that he slept good last night. He states that his biggest concern is how to deal with his employer when he leaves. He feels that through the weekend he will work on a plan for after discharge. He denies any SI/HI/AVH and contracts for safety. Denies any withdrawal symptoms.   Objective: Patient's chart and findings reviewed and discussed with treatment team. Patient presents in the day room interacting with peers and staff appropriately. He has been attending groups . He is pleasant and cooperative and is future oriented. Will continue current medications.   Principal Problem: Severe recurrent major depression without psychotic features ([place]) Diagnosis:   Patient Active Problem List   Diagnosis Date Noted  . Severe recurrent major depression without psychotic features ([place]) [F33.2] 02/28/2018  . Other fatigue [R53.83] 12/10/2017  . Routine general medical examination at a health care facility [Z00.00] 11/30/2016  . OSA on CPAP [G47.33, Z99.89] 05/31/2016  . Generalized anxiety disorder [F41.1] 12/02/2015  . Chronic diarrhea [K52.9] 05/11/2014   Total Time spent with patient: 15 minutes  Past Psychiatric History: See  H&P  Past Medical History:  Past Medical History:  Diagnosis Date  . Anxiety   . Depression   . IBS (irritable bowel syndrome)   . Sleep apnea    History reviewed. No pertinent surgical history. Family History:  Family History  Problem Relation Age of Onset  . Diabetes Mother        uncle  . Heart disease Mother        father  . Stomach cancer Mother   . Colon cancer Neg Hx    Family Psychiatric  History: See H&P Social History:  Social History   Substance and Sexual Activity  Alcohol Use Yes  . Alcohol/week: 3.6 oz  . Types: 6 Cans of beer per week   Comment: 0-3 per day     Social History   Substance and  Sexual Activity  Drug Use Not on file    Social History   Socioeconomic History  . Marital status: Married    Spouse name: None  . Number of children: None  . Years of education: None  . Highest education level: None  Social Needs  . Financial resource strain: None  . Food insecurity - worry: None  . Food insecurity - inability: None  . Transportation needs - medical: None  . Transportation needs - non-medical: None  Occupational History  . None  Tobacco Use  . Smoking status: Never Smoker  . Smokeless tobacco: Current User    Types: Snuff  Substance and Sexual Activity  . Alcohol use: Yes    Alcohol/week: 3.6 oz    Types: 6 Cans of beer per week    Comment: 0-3 per day  . Drug use: None  . Sexual activity: None  Other Topics Concern  . None  Social History Narrative  . None   Additional Social History:    Pain Medications: See MAR Prescriptions: See MAR Over the Counter: See MAR History of alcohol / drug use?: Yes Longest period of sobriety (when/how long): 14 dAYS Negative Consequences of Use: Financial, Legal, Personal relationships Withdrawal Symptoms: Agitation, Change in blood pressure, Patient aware of relationship between substance abuse and physical/medical complications, Blackouts, Irritability, Sweats, Weakness  Sleep: Good  Appetite:  Good  Current Medications: Current Facility-Administered Medications  Medication Dose Route Frequency Provider Last Rate Last Dose  . acetaminophen (TYLENOL) tablet 650 mg  650 mg Oral Q6H PRN Rozetta Nunnery, NP      . alum & mag hydroxide-simeth (MAALOX/MYLANTA) 200-200-20 MG/5ML suspension 30 mL  30 mL Oral Q4H PRN Lindon Romp A, NP      . chlordiazePOXIDE (LIBRIUM) capsule 25 mg  25 mg Oral QID PRN Cobos, Myer Peer, MD      . citalopram (CELEXA) tablet 10 mg  10 mg Oral Daily Cobos, Myer Peer, MD   10 mg at 03/01/18 0814  . folic acid (FOLVITE) tablet 1 mg  1 mg Oral Daily Cobos,  Myer Peer, MD   1 mg at 03/01/18 3557  . hydrOXYzine (ATARAX/VISTARIL) tablet 25 mg  25 mg Oral TID PRN Rozetta Nunnery, NP      . magnesium hydroxide (MILK OF MAGNESIA) suspension 30 mL  30 mL Oral Daily PRN Lindon Romp A, NP      . nicotine (NICODERM CQ - dosed in mg/24 hours) patch 21 mg  21 mg Transdermal Daily Cobos, Myer Peer, MD   21 mg at 03/01/18 0814  . thiamine (VITAMIN B-1) tablet 100 mg  100 mg Oral Daily Cobos, Myer Peer, MD   100 mg at 03/01/18 0814  . traZODone (DESYREL) tablet 50 mg  50 mg Oral QHS PRN Rozetta Nunnery, NP   50 mg at 02/28/18 2139    Lab Results:  Results for orders placed or performed during the hospital encounter of 02/27/18 (from the past 48 hour(s))  Comprehensive metabolic panel     Status: Abnormal   Collection Time: 02/27/18  3:13 PM  Result Value Ref Range   Sodium 138 135 - 145 mmol/L   Potassium 4.6 3.5 - 5.1 mmol/L   Chloride 101 101 - 111 mmol/L   CO2 25 22 - 32 mmol/L   Glucose, Bld 103 (H) 65 - 99 mg/dL   BUN 6 6 - 20 mg/dL   Creatinine, Ser 1.05 0.61 - 1.24 mg/dL   Calcium 9.5 8.9 - 10.3 mg/dL   Total Protein 7.0 6.5 - 8.1 g/dL   Albumin 4.3 3.5 - 5.0 g/dL   AST 26 15 - 41 U/L   ALT 27 17 - 63 U/L   Alkaline Phosphatase 57 38 - 126 U/L   Total Bilirubin 0.9 0.3 - 1.2 mg/dL   GFR calc non Af Amer >60 >60 mL/min   GFR calc Af Amer >60 >60 mL/min    Comment: (NOTE) The eGFR has been calculated using the CKD EPI equation. This calculation has not been validated in all clinical situations. eGFR's persistently <60 mL/min signify possible Chronic Kidney Disease.    Anion gap 12 5 - 15    Comment: Performed at [facility] [address], [place], [place] [address]  cbc     Status: None   Collection Time: 02/27/18  3:13 PM  Result Value Ref Range   WBC 9.0 4.0 - 10.5 K/uL   RBC 4.64 4.22 - 5.81 MIL/uL   Hemoglobin 14.9 13.0 - 17.0 g/dL   HCT 43.2 39.0 - 52.0 %   MCV 93.1 78.0 - 100.0 fL   MCH 32.1 26.0 - 34.0 pg   MCHC  34.5 30.0 - 36.0 g/dL   RDW 12.3 11.5 - 15.5 %   Platelets 205 150 - 400 K/uL  Comment: Performed at [facility], [place] [address] [place], [place] [address]  Rapid urine drug screen (hospital performed)     Status: Abnormal   Collection Time: 02/27/18  5:23 PM  Result Value Ref Range   Opiates NONE DETECTED NONE DETECTED   Cocaine NONE DETECTED NONE DETECTED   Benzodiazepines NONE DETECTED NONE DETECTED   Amphetamines POSITIVE (A) NONE DETECTED   Tetrahydrocannabinol NONE DETECTED NONE DETECTED   Barbiturates NONE DETECTED NONE DETECTED    Comment: (NOTE) DRUG SCREEN FOR MEDICAL PURPOSES ONLY.  IF CONFIRMATION IS NEEDED FOR ANY PURPOSE, NOTIFY LAB WITHIN 5 DAYS. LOWEST DETECTABLE LIMITS FOR URINE DRUG SCREEN Drug Class                     Cutoff (ng/mL) Amphetamine and metabolites    1000 Barbiturate and metabolites    200 Benzodiazepine                 130 Tricyclics and metabolites     300 Opiates and metabolites        300 Cocaine and metabolites        300 THC                            50 Performed at [facility], [place] [address] [place], [place] [address]   Ethanol     Status: None   Collection Time: 02/27/18  8:20 PM  Result Value Ref Range   Alcohol, Ethyl (B) <10 <10 mg/dL    Comment:        LOWEST DETECTABLE LIMIT FOR SERUM ALCOHOL IS 10 mg/dL FOR MEDICAL PURPOSES ONLY Performed at [facility], [place] [address], [place], [place] [address]   Salicylate level     Status: None   Collection Time: 02/27/18  8:20 PM  Result Value Ref Range   Salicylate Lvl <9.5 2.8 - 30.0 mg/dL    Comment: Performed at [place] [address], [place], [place] [address]  Acetaminophen level     Status: Abnormal   Collection Time: 02/27/18  8:20 PM  Result Value Ref Range   Acetaminophen (Tylenol), Serum <10 (L) 10 - 30 ug/mL    Comment:        THERAPEUTIC CONCENTRATIONS VARY SIGNIFICANTLY. A RANGE OF 10-30 ug/mL MAY BE AN  EFFECTIVE CONCENTRATION FOR MANY PATIENTS. HOWEVER, SOME ARE BEST TREATED AT CONCENTRATIONS OUTSIDE THIS RANGE. ACETAMINOPHEN CONCENTRATIONS >150 ug/mL AT 4 HOURS AFTER INGESTION AND >50 ug/mL AT 12 HOURS AFTER INGESTION ARE OFTEN ASSOCIATED WITH TOXIC REACTIONS. Performed at [facility], [place] [address], [place], [place] [address]     Blood Alcohol level:  Lab Results  Component Value Date   ETH <10 02/72/5366    Metabolic Disorder Labs: Lab Results  Component Value Date   HGBA1C 5.5 12/10/2017   No results found for: PROLACTIN Lab Results  Component Value Date   CHOL 145 12/10/2017   TRIG 132.0 12/10/2017   HDL 64.90 12/10/2017   CHOLHDL 2 12/10/2017   VLDL 26.4 12/10/2017   LDLCALC 53 12/10/2017   LDLCALC 88 11/30/2016    Physical Findings: AIMS: Facial and Oral Movements Muscles of Facial Expression: None, normal Lips and Perioral Area: None, normal Jaw: None, normal Tongue: None, normal,Extremity Movements Upper (arms, wrists, hands, fingers): None, normal Lower (legs, knees, ankles, toes): None, normal, Trunk Movements Neck, shoulders, hips: None, normal, Overall  Severity Severity of abnormal movements (highest score from questions above): None, normal Incapacitation due to abnormal movements: None, normal Patient's awareness of abnormal movements (rate only patient's report): No Awareness, Dental Status Current problems with teeth and/or dentures?: No Does patient usually wear dentures?: No  CIWA:  CIWA-Ar Total: 2 COWS:  COWS Total Score: 1  Musculoskeletal: Strength & Muscle Tone: within normal limits Gait & Station: normal Patient leans: N/A  Psychiatric Specialty Exam: Physical Exam  Nursing note and vitals reviewed. Constitutional: He is oriented to person, place, and time. He appears well-developed and well-nourished.  Cardiovascular: Normal rate.  Respiratory: Effort normal.  Musculoskeletal: Normal range of motion.  Neurological:  He is alert and oriented to person, place, and time.  Skin: Skin is warm.    Review of Systems  Constitutional: Negative.   HENT: Negative.   Eyes: Negative.   Respiratory: Negative.   Cardiovascular: Negative.   Gastrointestinal: Negative.   Genitourinary: Negative.   Musculoskeletal: Negative.   Skin: Negative.   Neurological: Negative.   Endo/Heme/Allergies: Negative.   Psychiatric/Behavioral: Positive for depression (mild). Negative for hallucinations and suicidal ideas. The patient is nervous/anxious (mild).     Blood pressure 118/82, pulse 70, temperature 97.6 F (36.4 C), temperature source Oral, resp. rate 16, height 5' 9"  (1.753 m), weight 73.9 kg (163 lb), SpO2 98 %.Body mass index is 24.07 kg/m.  General Appearance: Casual  Eye Contact:  Good  Speech:  Clear and Coherent and Normal Rate  Volume:  Normal  Mood:  Euthymic and reportd mild depressiona nd anxiety about his situation at home  Affect:  Congruent  Thought Process:  Goal Directed and Descriptions of Associations: Intact  Orientation:  Full (Time, Place, and Person)  Thought Content:  WDL  Suicidal Thoughts:  No  Homicidal Thoughts:  No  Memory:  Immediate;   Good Recent;   Good Remote;   Good  Judgement:  Good  Insight:  Good  Psychomotor Activity:  Normal  Concentration:  Concentration: Good and Attention Span: Good  Recall:  Good  Fund of Knowledge:  Good  Language:  Good  Akathisia:  No  Handed:  Right  AIMS (if indicated):     Assets:  Communication Skills Desire for Improvement Financial Resources/Insurance Housing Physical Health Social Support Transportation  ADL's:  Intact  Cognition:  WNL  Sleep:      Problems Addressed: MDD severe Alcohol abuse  Treatment Plan Summary: Daily contact with patient to assess and evaluate symptoms and progress in treatment, Medication management and Plan is to:  -Continue Celexa 10 mg PO Daily for mood stability -Continue Librium 25 mg PO QID PRN  for withdrawal symptoms -Continue Vistaril 25 mg PO TID PRN for anxiety -Continue Trazodone 50 mg PO QHS PRN for insomnia -Encourage group therapy participation  Lewis Shock, FNP 03/01/2018, 12:21 PM   Agree with NP Progress Note

## 2018-03-01 NOTE — BHH Group Notes (Signed)
LCSW Group Therapy Note  03/01/2018   9:30-10:30am (300 hall)                10:30-11:30am (400 hall)                11:30am-12:00pm (500 hall)  Type of Therapy and Topic:  Group Therapy: Anger Cues and Responses  Participation Level:  Active   Description of Group:   In this group, patients learned how to recognize the physical, cognitive, emotional, and behavioral responses they have to anger-provoking situations.  They identified a recent time they became angry and how they reacted.  They analyzed how their reaction was possibly beneficial and how it was possibly unhelpful.  The group discussed a variety of healthier coping skills that could help with such a situation in the future.  Deep breathing was practiced briefly.  Therapeutic Goals: 1. Patients will remember their last incident of anger and how they felt emotionally and physically, what their thoughts were at the time, and how they behaved. 2. Patients will identify how their behavior at that time worked for them, as well as how it worked against them. 3. Patients will explore possible new behaviors to use in future anger situations. 4. Patients will learn that anger itself is normal and cannot be eliminated, and that healthier reactions can assist with resolving conflict rather than worsening situations.  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  He has decided he now needs to apologize to that individual and they can come up together with a better response for the future.  Therapeutic Modalities:   Cognitive Behavioral Therapy  Lynnell ChadMareida J Grossman-Orr  03/01/2018 12:00pm

## 2018-03-01 NOTE — Progress Notes (Signed)
Patient ID: Gregory Vaughan, male   DOB: 08-Jun-1971, 47 y.o.   MRN: [id number]  Pt currently presents with a masked affect and cooperative behavior. Pt reports ongoing depression. Minimal interaction with peers noted. Pt states "My day was better than yesterday but still at a 5." Pt reports good sleep with current medication regimen.   Pt provided with medications per providers orders. Pt's labs and vitals were monitored throughout the night. Pt given a 1:1 about emotional and mental status. Pt supported and encouraged to express concerns and questions. Pt educated on medications.  Pt's safety ensured with 15 minute and environmental checks. Pt currently denies SI/HI and A/V hallucinations. Pt verbally agrees to seek staff if SI/HI or A/VH occurs and to consult with staff before acting on any harmful thoughts. Will continue POC.

## 2018-03-01 NOTE — Progress Notes (Signed)
D: Patient alert and oriented. Affect/mood: Pleasant, cooperative. Endorses feelings of depression, though denies SI, HI, AVH at this time. Expresses anxiety related to relationship with wife and related plans after discharge. Denies pain. Goal: "to deal with rejection". Patient shares that he intends to accomplish this goal by "telling myself that friends aren't avoiding me when they don't answer a phone call". Patient reports "good" sleep without the use of sleep medication, "good" appetite and concentration, and "normal" energy level. Rates depression and anxiety "2" (0-10), and hopelessness "0" (0-10).   A: Scheduled medications administered to patient per MD order. Support and encouragement provided. Routine safety checks conducted every 15 minutes. Patient informed to notify staff with problems or concerns. Encouraged to notify staff if feelings of harm toward self or others arise. Patient agrees.  R: No adverse drug reactions noted. Patient contracts for safety at this time. Patient compliant with medications and treatment plan. Patient receptive, calm, and cooperative. Patient interacts well with others on the unit. Patient remains safe at this time. Will continue to monitor.

## 2018-03-01 NOTE — Plan of Care (Signed)
Patient has remained compliant with prescribed therapeutic medication regimen. Verbalizes understanding of scheduled medications and agrees to notify staff if questions or concerns in regard to medications arise.

## 2018-03-02 MED ORDER — LOPERAMIDE HCL 2 MG PO CAPS
2.0000 mg | ORAL_CAPSULE | ORAL | Status: DC | PRN
  Administered 2018-03-02: 2 mg via ORAL
  Filled 2018-03-02: qty 1

## 2018-03-02 NOTE — [facility] Group Notes (Signed)
[facility]  Date/Time:  03/02/2018  10:00AM-11:00AM  Type of Therapy and Topic:  Group Therapy:  Music and Mood  Participation Level:  Active   Description of Group: In this process group, members listened to a variety of genres of music and identified that different types of music evoke different responses.  Patients were encouraged to identify music that was soothing for them and music that was energizing for them.  Patients discussed how this knowledge can help with wellness and recovery in various ways including managing depression and anxiety as well as encouraging healthy sleep habits.    Therapeutic Goals: 1. Patients will explore the impact of different varieties of music on mood 2. Patients will verbalize the thoughts they have when listening to different types of music 3. Patients will identify music that is soothing to them as well as music that is energizing to them 4. Patients will discuss how to use this knowledge to assist in maintaining wellness and recovery 5. Patients will explore the use of music as a coping skill  Summary of Patient Progress:  At the beginning of group, patient expressed that he felt good and at the end he felt more introspective.  He participated fully in group.  Therapeutic Modalities: Solution Focused Brief Therapy Activity   Ambrose MantleMareida Grossman-Orr, LCSW

## 2018-03-02 NOTE — B[facility] Group Notes (Signed)
Orientation / Goals   Date:  03/02/2018  Time:  10:15 AM  Type of Therapy:  Nurse Education  Participation Level:  Active  /  The group focuses on teaching patients who their staff is, what the staff's responsibiliites are and what the unit programming looks like.  Participation Quality:  Attentive  Affect:  Appropriate  Cognitive:  Alert  Insight:  Good  Engagement in Group:  Engaged  Modes of Intervention:  Education  Summary of Progress/Problems:  Rich BraveDuke, Freeman Borba Lynn 03/02/2018, 10:15 AM

## 2018-03-02 NOTE — [facility] Group Notes (Signed)
Adult Psychoeducational Group Note  Date:  03/02/2018 Time:  9:06 PM  Group Topic/Focus:  Wrap-Up Group:   The focus of this group is to help patients review their daily goal of treatment and discuss progress on daily workbooks.  Participation Level:  Active  Participation Quality:  Appropriate and Attentive  Affect:  Appropriate  Cognitive:  Alert and Appropriate  Insight: Appropriate and Good  Engagement in Group:  Engaged  Modes of Intervention:  Discussion and Education  Additional Comments:  Pt attended and participated in wrap up group this evening. Pt had a good day due to them having a good night sleep, good dreams and they received a discharge date. Pt has an appointment set up with a therapist at an outpatient facility.   Chrisandra NettersOctavia A Samarah Hogle 03/02/2018, 9:06 PM

## 2018-03-02 NOTE — Plan of Care (Signed)
  Safety: Periods of time without injury will increase 03/02/2018 2137 - Progressing by Delos HaringPhillips, Jakaylee Sasaki A, RN Note Pt safe on the unit at this time

## 2018-03-02 NOTE — Progress Notes (Signed)
D: Pt denies SI/HI/AVH. Pt is pleasant and cooperative. Pt stated he was better, ready to leave and move on, will be doing "outside counseling".  A: Pt was offered support and encouragement. Pt was given scheduled medications. Pt was encourage to attend groups. Q 15 minute checks were done for safety.   R:Pt attends groups and interacts well with peers and staff. Pt is taking medication. Pt receptive to treatment and safety maintained on unit.

## 2018-03-02 NOTE — Progress Notes (Addendum)
[facility] MD Progress Note  03/02/2018 2:32 PM Gregory Vaughan  MRN:  [id number]   Subjective:  Patient states that he has been doing good today. He reports that he has not had any withdrawals, no depression, no anxiety, and denies any SI/HI/AVH and contracts for safety. 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 I don't think I'm obsessed with it." He reports that he has an appointment tomorrow at [facility] in [place] for his addiction. He reports that he is ashamed of it and when he tries to avoid watching porn he goes to a public bar because it's frowned upon to watch porn in public and then drinks alcohol. He feels if his sex addiction is fixed then he would not be leaving to go to the bar. This is also a large reason for his failing marriage.     Objective: Patient's chart and findings reviewed and discussed with treatment team. Patient has been attending groups and participating. He has been seen interacting with peers and staff appropriately. His wife contacted the RN with concern about his sex addiction and requested a 30 day program for him. However, there is no 30 day program available that is covered by insurance. He is provided with multiple options for after care and he has an appointment scheduled already with a sex therapist. Potential discharge tomorrow.   Principal Problem: Severe recurrent major depression without psychotic features (HCC) Diagnosis:   Patient Active Problem List   Diagnosis Date Noted  . Alcohol abuse [F10.10] 03/01/2018  . Severe recurrent major depression without psychotic features (HCC) [F33.2] 02/28/2018  . Other fatigue [R53.83] 12/10/2017  . Routine general medical examination at a health care facility [Z00.00] 11/30/2016  . OSA on CPAP [G47.33, Z99.89] 05/31/2016  . Generalized anxiety disorder [F41.1] 12/02/2015  . Chronic diarrhea [K52.9] 05/11/2014   Total Time spent with patient: 15  minutes  Past Psychiatric History: See  H&P  Past Medical History:  Past Medical History:  Diagnosis Date  . Anxiety   . Depression   . IBS (irritable bowel syndrome)   . Sleep apnea    History reviewed. No pertinent surgical history. Family History:  Family History  Problem Relation Age of Onset  . Diabetes Mother        uncle  . Heart disease Mother        father  . Stomach cancer Mother   . Colon cancer Neg Hx    Family Psychiatric  History: See H&P Social History:  Social History   Substance and Sexual Activity  Alcohol Use Yes  . Alcohol/week: 3.6 oz  . Types: 6 Cans of beer per week   Comment: 0-3 per day     Social History   Substance and Sexual Activity  Drug Use Not on file    Social History   Socioeconomic History  . Marital status: Married    Spouse name: None  . Number of children: None  . Years of education: None  . Highest education level: None  Social Needs  . Financial resource strain: None  . Food insecurity - worry: None  . Food insecurity - inability: None  . Transportation needs - medical: None  . Transportation needs - non-medical: None  Occupational History  . None  Tobacco Use  . Smoking status: Never Smoker  . Smokeless tobacco: Current User    Types: Snuff  Substance and Sexual Activity  . Alcohol use:  Yes    Alcohol/week: 3.6 oz    Types: 6 Cans of beer per week    Comment: 0-3 per day  . Drug use: None  . Sexual activity: None  Other Topics Concern  . None  Social History Narrative  . None   Additional Social History:    Pain Medications: See MAR Prescriptions: See MAR Over the Counter: See MAR History of alcohol / drug use?: Yes Longest period of sobriety (when/how long): 14 dAYS Negative Consequences of Use: Financial, Legal, Personal relationships Withdrawal Symptoms: Agitation, Change in blood pressure, Patient aware of relationship between substance abuse and physical/medical complications, Blackouts,  Irritability, Sweats, Weakness                    Sleep: Good  Appetite:  Good  Current Medications: Current Facility-Administered Medications  Medication Dose Route Frequency Provider Last Rate Last Dose  . acetaminophen (TYLENOL) tablet 650 mg  650 mg Oral Q6H PRN Jackelyn PolingBerry, Jason A, NP      . alum & mag hydroxide-simeth (MAALOX/MYLANTA) 200-200-20 MG/5ML suspension 30 mL  30 mL Oral Q4H PRN Nira ConnBerry, Jason A, NP      . chlordiazePOXIDE (LIBRIUM) capsule 25 mg  25 mg Oral QID PRN Elizabet Schweppe, Rockey SituFernando A, MD      . citalopram (CELEXA) tablet 10 mg  10 mg Oral Daily Sirenity Shew, Rockey SituFernando A, MD   10 mg at 03/02/18 0818  . folic acid (FOLVITE) tablet 1 mg  1 mg Oral Daily Tally Mattox, Rockey SituFernando A, MD   1 mg at 03/02/18 0818  . hydrOXYzine (ATARAX/VISTARIL) tablet 25 mg  25 mg Oral TID PRN Nira ConnBerry, Jason A, NP      . magnesium hydroxide (MILK OF MAGNESIA) suspension 30 mL  30 mL Oral Daily PRN Nira ConnBerry, Jason A, NP      . nicotine (NICODERM CQ - dosed in mg/24 hours) patch 21 mg  21 mg Transdermal Daily Ariani Seier, Rockey SituFernando A, MD   21 mg at 03/02/18 1105  . thiamine (VITAMIN B-1) tablet 100 mg  100 mg Oral Daily Makeya Hilgert, Rockey SituFernando A, MD   100 mg at 03/02/18 0818  . traZODone (DESYREL) tablet 50 mg  50 mg Oral QHS PRN Jackelyn PolingBerry, Jason A, NP   50 mg at 03/01/18 2115    Lab Results:  No results found for this or any previous visit (from the past 48 hour(s)).  Blood Alcohol level:  Lab Results  Component Value Date   ETH <10 02/27/2018    Metabolic Disorder Labs: Lab Results  Component Value Date   HGBA1C 5.5 12/10/2017   No results found for: PROLACTIN Lab Results  Component Value Date   CHOL 145 12/10/2017   TRIG 132.0 12/10/2017   HDL 64.90 12/10/2017   CHOLHDL 2 12/10/2017   VLDL 26.4 12/10/2017   LDLCALC 53 12/10/2017   LDLCALC 88 11/30/2016    Physical Findings: AIMS: Facial and Oral Movements Muscles of Facial Expression: None, normal Lips and Perioral Area: None, normal Jaw: None, normal Tongue:  None, normal,Extremity Movements Upper (arms, wrists, hands, fingers): None, normal Lower (legs, knees, ankles, toes): None, normal, Trunk Movements Neck, shoulders, hips: None, normal, Overall Severity Severity of abnormal movements (highest score from questions above): None, normal Incapacitation due to abnormal movements: None, normal Patient's awareness of abnormal movements (rate only patient's report): No Awareness, Dental Status Current problems with teeth and/or dentures?: No Does patient usually wear dentures?: No  CIWA:  CIWA-Ar Total: 2 COWS:  COWS Total Score:  1  Musculoskeletal: Strength & Muscle Tone: within normal limits Gait & Station: normal Patient leans: N/A  Psychiatric Specialty Exam: Physical Exam  Nursing note and vitals reviewed. Constitutional: He is oriented to person, place, and time. He appears well-developed and well-nourished.  Cardiovascular: Normal rate.  Respiratory: Effort normal.  Musculoskeletal: Normal range of motion.  Neurological: He is alert and oriented to person, place, and time.  Skin: Skin is warm.    Review of Systems  Constitutional: Negative.   HENT: Negative.   Eyes: Negative.   Respiratory: Negative.   Cardiovascular: Negative.   Gastrointestinal: Negative.   Genitourinary: Negative.   Musculoskeletal: Negative.   Skin: Negative.   Neurological: Negative.   Endo/Heme/Allergies: Negative.   Psychiatric/Behavioral: Negative.  Negative for depression, hallucinations and suicidal ideas. The patient is not nervous/anxious.     Blood pressure 130/83, pulse 63, temperature 99.1 F (37.3 C), temperature source Oral, resp. rate 18, height 5\' 9"  (1.753 m), weight 73.9 kg (163 lb), SpO2 98 %.Body mass index is 24.07 kg/m.  General Appearance: Casual  Eye Contact:  Good  Speech:  Clear and Coherent and Normal Rate  Volume:  Normal  Mood:  Euthymic  Affect:  Congruent  Thought Process:  Goal Directed and Descriptions of  Associations: Intact  Orientation:  Full (Time, Place, and Person)  Thought Content:  WDL  Suicidal Thoughts:  No  Homicidal Thoughts:  No  Memory:  Immediate;   Good Recent;   Good Remote;   Good  Judgement:  Good  Insight:  Good  Psychomotor Activity:  Normal  Concentration:  Concentration: Good and Attention Span: Good  Recall:  Good  Fund of Knowledge:  Good  Language:  Good  Akathisia:  No  Handed:  Right  AIMS (if indicated):     Assets:  Communication Skills Desire for Improvement Financial Resources/Insurance Housing Physical Health Social Support Transportation  ADL's:  Intact  Cognition:  WNL  Sleep:  Number of Hours: 5.75(time change affected)   Problems Addressed: MDD severe Alcohol abuse  Treatment Plan Summary: Daily contact with patient to assess and evaluate symptoms and progress in treatment, Medication management and Plan is to:  -Continue Celexa 10 mg PO Daily for mood stability -Continue Librium 25 mg PO QID PRN for withdrawal symptoms -Continue Vistaril 25 mg PO TID PRN for anxiety -Continue Trazodone 50 mg PO QHS PRN for insomnia -Encourage group therapy participation -Potential discharge tomorrow  Maryfrances Bunnell, FNP 03/02/2018, 2:32 PM Agree with NP Progress Note

## 2018-03-03 MED ORDER — CITALOPRAM HYDROBROMIDE 10 MG PO TABS: 10.0000 mg | ORAL_TABLET | Freq: Every day | ORAL | 0 refills | Status: DC

## 2018-03-03 MED ORDER — HYDROXYZINE HCL 25 MG PO TABS: 25.0000 mg | ORAL_TABLET | Freq: Three times a day (TID) | ORAL | 0 refills | Status: AC | PRN

## 2018-03-03 MED ORDER — TRAZODONE HCL 50 MG PO TABS: 50.0000 mg | ORAL_TABLET | Freq: Every evening | ORAL | 0 refills | Status: AC | PRN

## 2018-03-03 NOTE — [facility] Group Notes (Signed)
[facility] LCSW Group Therapy Note  Date/Time: 03/03/18, 1315  Type of Therapy and Topic:  Group Therapy:  Overcoming Obstacles  Participation Level:  Did not attend  Description of Group:    In this group patients will be encouraged to explore what they see as obstacles to their own wellness and recovery. They will be guided to discuss their thoughts, feelings, and behaviors related to these obstacles. The group will process together ways to cope with barriers, with attention given to specific choices patients can make. Each patient will be challenged to identify changes they are motivated to make in order to overcome their obstacles. This group will be process-oriented, with patients participating in exploration of their own experiences as well as giving and receiving support and challenge from other group members.  Therapeutic Goals: 1. Patient will identify personal and current obstacles as they relate to admission. 2. Patient will identify barriers that currently interfere with their wellness or overcoming obstacles.  3. Patient will identify feelings, thought process and behaviors related to these barriers. 4. Patient will identify two changes they are willing to make to overcome these obstacles:    Summary of Patient Progress      Therapeutic Modalities:   Cognitive Behavioral Therapy Solution Focused Therapy Motivational Interviewing Relapse Prevention Therapy  Greg Jniya Madara, LCSW 

## 2018-03-03 NOTE — Progress Notes (Signed)
Recreation Therapy Notes   Date:3.11.19 Time:9:30 a.m. Location: [address] Dayroom  Group Topic: Stress Management  Goal Area(s) Addresses:  Goal 1.1: To reduce stress  -Patient will report feeling a reduction in stress level  -Patient will identify the importance of stress management  -Patient will participate during stress management group treatment    Behavioral Response:Patient did not attend   Sheryle Hailarian Lindora Alviar, Recreation Therapy Intern   Sheryle HailDarian Mahitha Hickling 03/03/2018 11:16 AM

## 2018-03-03 NOTE — Progress Notes (Signed)
Pt d/c from the hospital. All items returned. D/C instructions given and prescriptions given. Pt denies si and hi. 

## 2018-03-03 NOTE — Progress Notes (Signed)
  [facility] :  Will you be returning to the same living situation after discharge:  Yes,  with friend At discharge, do you have transportation home?: Yes,  father Do you have the ability to pay for your medications: Yes,  [employer]  Release of information consent forms completed and in the chart;  Patient's signature needed at discharge.  Patient to Follow up at: Follow-up Information    [facility], [facility]. Go on 04/01/2018.   Specialty:  Behavioral Health Why:  Please attend your medication appt on Tuesday, 04/01/18, at 10:15am. Contact information: [address] [place] [address] [phone]        [facility]. Go on 03/03/2018.   Why:  Please attend your therapy appt with Haydee SalterLaura Lohr on Monday, 03/03/18, at 3:00pm. Contact information: [address] [place], [place] [address] P: [phone] F: [phone]          Next level of care provider has access to [facility] Link:no  Safety Planning and Suicide Prevention discussed: No. Pt declined.  Have you used any form of tobacco in the last 30 days? (Cigarettes, Smokeless Tobacco, Cigars, and/or Pipes): Yes  Has patient been referred to the Quitline?: Patient refused referral  Patient has been referred for addiction treatment: Yes  Lorri FrederickWierda, Gaspard Isbell Jon, LCSW 03/03/2018, 10:44 AM

## 2018-03-03 NOTE — Discharge Summary (Addendum)
Physician Discharge Summary Note  Patient:  Gregory Vaughan is an 47 y.o., male MRN:  [id number] DOB:  [birth year] Patient phone:  [phone] (home)  Patient address:   [address] [place] [address],  Total Time spent with patient: 20 minutes  Date of Admission:  02/28/2018 Date of Discharge: 03/03/18  Reason for Admission:  Worsening depression with SI  Principal Problem: Severe recurrent major depression without psychotic features [facility]) Discharge Diagnoses: Patient Active Problem List   Diagnosis Date Noted  . Alcohol abuse [F10.10] 03/01/2018  . Severe recurrent major depression without psychotic features ([place]) [F33.2] 02/28/2018  . Other fatigue [R53.83] 12/10/2017  . Routine general medical examination at a health care facility [Z00.00] 11/30/2016  . OSA on CPAP [G47.33, Z99.89] 05/31/2016  . Generalized anxiety disorder [F41.1] 12/02/2015  . Chronic diarrhea [K52.9] 05/11/2014    Past Psychiatric History: No depression, no previous psychiatric hospitalizations, has never attempted suicide, no history of self injurious behaviors, no history of psychosis, denies history of mania , history of depression. Reports history of anxiety, described mostly as tendency to worry.  States he has been diagnosed with ADHD  Past Medical History:  Past Medical History:  Diagnosis Date  . Anxiety   . Depression   . IBS (irritable bowel syndrome)   . Sleep apnea    History reviewed. No pertinent surgical history. Family History:  Family History  Problem Relation Age of Onset  . Diabetes Mother        uncle  . Heart disease Mother        father  . Stomach cancer Mother   . Colon cancer Neg Hx    Family Psychiatric  History: Mother had history of depression, father has history of alcohol abuse . No history of suicide in family  Social History:  Social History   Substance and Sexual Activity  Alcohol Use Yes  . Alcohol/week: 3.6 oz  . Types: 6 Cans of beer per week    Comment: 0-3 per day     Social History   Substance and Sexual Activity  Drug Use Not on file    Social History   Socioeconomic History  . Marital status: Married    Spouse name: None  . Number of children: None  . Years of education: None  . Highest education level: None  Social Needs  . Financial resource strain: None  . Food insecurity - worry: None  . Food insecurity - inability: None  . Transportation needs - medical: None  . Transportation needs - non-medical: None  Occupational History  . None  Tobacco Use  . Smoking status: Never Smoker  . Smokeless tobacco: Current User    Types: Snuff  Substance and Sexual Activity  . Alcohol use: Yes    Alcohol/week: 3.6 oz    Types: 6 Cans of beer per week    Comment: 0-3 per day  . Drug use: None  . Sexual activity: None  Other Topics Concern  . None  Social History Narrative  . None    Hospital Course:   02/28/18 [facility] MD Assessment: 47 year old married male, presented to ED yesterday voluntarily due to feeling depressed and suicidal , but without specific plan or intention. He had been in a couples therapy session earlier in the day , and states that his wife recently announced her wanting to separate. He also reports work as stressful, with recent organizational changes that have been overwhelming at times. He reports his depression has been  chronic, going back to grief over his mother's death in 02-17-08. Describes depression as intermittent, with a waxing and waning course. Of note, reports he had been drinking daily, up to 6 beers per day, but states he had " slowed down a lot " over the last 2-3 weeks, at the recommendation of marriage counselor. Denies having had any alcohol WDL symptoms. Admission UDS positive for Amphetamines ( patient reports he is prescribed Vyvanse ) . Admission BAL <7.0. Endorses neuro -vegetative symptoms as below. Denies psychotic symptoms  Patient remained on the [facility] unit for 3 days and stabilized  with sobriety, medication, and therapy. Patient was started on Celexa 10 mg Daily, Trazodone 50 mg QHS PRN, and Vistaril 25 mg TID PRN. Patient also reported that he is addicted to sex and watches pornography a lot, which leads to the alcohol drinking. Patient has shown improvement with improved mood, affect, sleep, appetite, and interaction. Patient has been seen in the day room interacting with peers and staff appropriately. He has been attending group and participating. Patient agrees to follow up at [facility] for [facility] therapy. Patient denies any SI/HI/AVh and contracts for safety. Patient is provided with prescriptions for his medications upon discharge.     Physical Findings: AIMS: Facial and Oral Movements Muscles of Facial Expression: None, normal Lips and Perioral Area: None, normal Jaw: None, normal Tongue: None, normal,Extremity Movements Upper (arms, wrists, hands, fingers): None, normal Lower (legs, knees, ankles, toes): None, normal, Trunk Movements Neck, shoulders, hips: None, normal, Overall Severity Severity of abnormal movements (highest score from questions above): None, normal Incapacitation due to abnormal movements: None, normal Patient's awareness of abnormal movements (rate only patient's report): No Awareness, Dental Status Current problems with teeth and/or dentures?: No Does patient usually wear dentures?: No  CIWA:  CIWA-Ar Total: 2 COWS:  COWS Total Score: 1  Musculoskeletal: Strength & Muscle Tone: within normal limits Gait & Station: normal Patient leans: N/A  Psychiatric Specialty Exam: Physical Exam  Nursing note and vitals reviewed. Constitutional: He is oriented to person, place, and time. He appears well-developed and well-nourished.  Cardiovascular: Normal rate.  Respiratory: Effort normal.  Musculoskeletal: Normal range of motion.  Neurological: He is alert and oriented to person, place, and time.  Skin: Skin is warm.     Review of Systems  Constitutional: Negative.   HENT: Negative.   Eyes: Negative.   Respiratory: Negative.   Cardiovascular: Negative.   Gastrointestinal: Negative.   Genitourinary: Negative.   Musculoskeletal: Negative.   Skin: Negative.   Neurological: Negative.   Endo/Heme/Allergies: Negative.   Psychiatric/Behavioral: Negative.     Blood pressure 133/84, pulse 65, temperature 98.3 F (36.8 C), temperature source Oral, resp. rate 12, height _0  (1.753 m), weight 73.9 kg (163 lb), SpO2 98 %.Body mass index is 24.07 kg/m.  General Appearance: Casual  Eye Contact:  Good  Speech:  Clear and Coherent and Normal Rate  Volume:  Normal  Mood:  Euthymic  Affect:  Congruent  Thought Process:  Goal Directed and Descriptions of Associations: Intact  Orientation:  Full (Time, Place, and Person)  Thought Content:  WDL  Suicidal Thoughts:  No  Homicidal Thoughts:  No  Memory:  Immediate;   Good Recent;   Good Remote;   Good  Judgement:  Good  Insight:  Good  Psychomotor Activity:  Normal  Concentration:  Concentration: Good and Attention Span: Good  Recall:  Good  Fund of Knowledge:  Good  Language:  Good  Akathisia:  No  Handed:  Right  AIMS (if indicated):     Assets:  Communication Skills Desire for Improvement Financial Resources/Insurance Housing Physical Health Social Support Transportation  ADL's:  Intact  Cognition:  WNL  Sleep:  Number of Hours: 5.75     Have you used any form of tobacco in the last 30 days? (Cigarettes, Smokeless Tobacco, Cigars, and/or Pipes): Yes  Has this patient used any form of tobacco in the last 30 days? (Cigarettes, Smokeless Tobacco, Cigars, and/or Pipes) Yes, Yes, A prescription for an FDA-approved tobacco cessation medication was offered at discharge and the patient refused  Blood Alcohol level:  Lab Results  Component Value Date   ETH <10 94/85/4627    Metabolic Disorder Labs:  Lab Results  Component Value Date   HGBA1C  5.5 12/10/2017   No results found for: PROLACTIN Lab Results  Component Value Date   CHOL 145 12/10/2017   TRIG 132.0 12/10/2017   HDL 64.90 12/10/2017   CHOLHDL 2 12/10/2017   VLDL 26.4 12/10/2017   LDLCALC 53 12/10/2017   [place] 88 11/30/2016    See Psychiatric Specialty Exam and Suicide Risk Assessment completed by Attending Physician prior to discharge.  Discharge destination:  Home  Is patient on multiple antipsychotic therapies at discharge:  No   Has Patient had three or more failed trials of antipsychotic monotherapy by history:  No  Recommended Plan for Multiple Antipsychotic Therapies: NA   Allergies as of 03/03/2018      Reactions   Codeine Hives   Other Diarrhea   Romaine and Iceberg lettuce      Medication List    STOP taking these medications   B-12 COMPLIANCE INJECTION 1000 MCG/ML Kit Generic drug:  Cyanocobalamin   DULoxetine 60 MG capsule Commonly known as:  CYMBALTA   ibuprofen 200 MG tablet Commonly known as:  ADVIL,MOTRIN   loperamide 2 MG capsule Commonly known as:  IMODIUM   Vitamin D (Ergocalciferol) 50000 units Caps capsule Commonly known as:  DRISDOL   VYVANSE 40 MG capsule Generic drug:  lisdexamfetamine     TAKE these medications     Indication  citalopram 10 MG tablet Commonly known as:  CELEXA Take 1 tablet (10 mg total) by mouth daily. For mood control Start taking on:  03/04/2018  Indication:  mood stability   hydrOXYzine 25 MG tablet Commonly known as:  ATARAX/VISTARIL Take 1 tablet (25 mg total) by mouth 3 (three) times daily as needed for anxiety.  Indication:  Feeling Anxious   traZODone 50 MG tablet Commonly known as:  DESYREL Take 1 tablet (50 mg total) by mouth at bedtime as needed for sleep.  Indication:  Trouble Sleeping      Follow-up Information    [facility]. Go on 04/01/2018.   Specialty:  Behavioral Health Why:  Please attend your medication appt on Tuesday, 04/01/18, at  10:15am. Contact information: [address] [place] [address] [phone]        [facility]. Go on 03/03/2018.   Why:  Please attend your therapy appt with Festus Aloe on Monday, 03/03/18, at 3:00pm. Contact information: [place], [place] [address] P: [phone] F: [phone]          Follow-up recommendations:  Continue activity as tolerated. Continue diet as recommended by your PCP. Ensure to keep all appointments with outpatient providers.  Comments:  Patient is instructed prior to discharge to: Take all medications as prescribed by his/her  mental healthcare provider. Report any adverse effects and or reactions from the medicines to his/her outpatient provider promptly. Patient has been instructed & cautioned: To not engage in alcohol and or illegal drug use while on prescription medicines. In the event of worsening symptoms, patient is instructed to call the crisis hotline, 911 and or go to the nearest ED for appropriate evaluation and treatment of symptoms. To follow-up with his/her primary care provider for your other medical issues, concerns and or health care needs.    Signed: [place], FNP 03/03/2018, 3:35 PM   Patient seen, Suicide Assessment Completed.  Disposition Plan Reviewed

## 2018-03-03 NOTE — Tx Team (Signed)
Interdisciplinary Treatment and Diagnostic Plan Update  03/03/2018 Time of Session: 10:50am Gregory Vaughan MRN: [id number]  Principal Diagnosis: Severe recurrent major depression without psychotic features [facility])  Secondary Diagnoses: Principal Problem:   Severe recurrent major depression without psychotic features ([place]) Active Problems:   Alcohol abuse   Current Medications:  Current Facility-Administered Medications  Medication Dose Route Frequency Provider Last Rate Last Dose  . acetaminophen (TYLENOL) tablet 650 mg  650 mg Oral Q6H PRN Rozetta Nunnery, NP      . alum & mag hydroxide-simeth (MAALOX/MYLANTA) 200-200-20 MG/5ML suspension 30 mL  30 mL Oral Q4H PRN Lindon Romp A, NP      . chlordiazePOXIDE (LIBRIUM) capsule 25 mg  25 mg Oral QID PRN Cobos, Myer Peer, MD      . citalopram (CELEXA) tablet 10 mg  10 mg Oral Daily Cobos, Myer Peer, MD   10 mg at 03/03/18 0803  . folic acid (FOLVITE) tablet 1 mg  1 mg Oral Daily Cobos, Myer Peer, MD   1 mg at 03/03/18 0803  . hydrOXYzine (ATARAX/VISTARIL) tablet 25 mg  25 mg Oral TID PRN Rozetta Nunnery, NP   25 mg at 03/02/18 2208  . loperamide (IMODIUM) capsule 2 mg  2 mg Oral PRN Cobos, Myer Peer, MD   2 mg at 03/02/18 1535  . magnesium hydroxide (MILK OF MAGNESIA) suspension 30 mL  30 mL Oral Daily PRN Lindon Romp A, NP      . nicotine (NICODERM CQ - dosed in mg/24 hours) patch 21 mg  21 mg Transdermal Daily Cobos, Myer Peer, MD   21 mg at 03/03/18 0802  . thiamine (VITAMIN B-1) tablet 100 mg  100 mg Oral Daily Cobos, Myer Peer, MD   100 mg at 03/03/18 0803  . traZODone (DESYREL) tablet 50 mg  50 mg Oral QHS PRN Rozetta Nunnery, NP   50 mg at 03/02/18 2208   PTA Medications: Medications Prior to Admission  Medication Sig Dispense Refill Last Dose  . DULoxetine (CYMBALTA) 60 MG capsule Take 60 mg by mouth daily.    02/27/2018 at Unknown time  . Vitamin D, Ergocalciferol, (DRISDOL) 50000 units CAPS capsule Take 1 capsule (50,000 Units  total) by mouth every 7 (seven) days. (Patient taking differently: Take 50,000 Units by mouth every Saturday. ) 12 capsule 0 02/28/2018 at Unknown time  . VYVANSE 40 MG capsule Take 40 mg by mouth daily.    02/27/2018 at Unknown time  . Cyanocobalamin (B-12 COMPLIANCE INJECTION) 1000 MCG/ML KIT Inject 1,000 mcg as directed every 7 (seven) days.   Unknown at Unknown time  . ibuprofen (ADVIL,MOTRIN) 200 MG tablet Take 400-800 mg by mouth every 6 (six) hours as needed (for pain or headaches).   Unknown at Unknown time  . loperamide (IMODIUM) 2 MG capsule Take 8 mg by mouth every morning.    02/27/2018 at am    Patient Stressors: Educational concerns Financial difficulties  Patient Strengths: Ability for insight Active sense of humor Average or above average intelligence  Treatment Modalities: Medication Management, Group therapy, Case management,  1 to 1 session with clinician, Psychoeducation, Recreational therapy.   Physician Treatment Plan for Primary Diagnosis: Severe recurrent major depression without psychotic features ([place])  Medication Management: Evaluate patient's response, side effects, and tolerance of medication regimen.  Therapeutic Interventions: 1 to 1 sessions, Unit Group sessions and Medication administration.  Evaluation of Outcomes: Adequate for Discharge  Physician Treatment Plan for Secondary Diagnosis: Principal Problem:   Severe  recurrent major depression without psychotic features ([place]) Active Problems:   Alcohol abuse  Medication Management: Evaluate patient's response, side effects, and tolerance of medication regimen.  Therapeutic Interventions: 1 to 1 sessions, Unit Group sessions and Medication administration.  Evaluation of Outcomes: Adequate for Discharge   RN Treatment Plan for Primary Diagnosis: Severe recurrent major depression without psychotic features ([place]) Long Term Goal(s): Knowledge of disease and therapeutic regimen to maintain health will  improve  Short Term Goals: Ability to remain free from injury will improve, Ability to verbalize frustration and anger appropriately will improve, Ability to demonstrate self-control, Ability to participate in decision making will improve, Ability to verbalize feelings will improve, Ability to disclose and discuss suicidal ideas, Ability to identify and develop effective coping behaviors will improve and Compliance with prescribed medications will improve  Medication Management: RN will administer medications as ordered by provider, will assess and evaluate patient's response and provide education to patient for prescribed medication. RN will report any adverse and/or side effects to prescribing provider.  Therapeutic Interventions: 1 on 1 counseling sessions, Psychoeducation, Medication administration, Evaluate responses to treatment, Monitor vital signs and CBGs as ordered, Perform/monitor CIWA, COWS, AIMS and Fall Risk screenings as ordered, Perform wound care treatments as ordered.  Evaluation of Outcomes: Adequate for Discharge   LCSW Treatment Plan for Primary Diagnosis: Severe recurrent major depression without psychotic features ([place]) Long Term Goal(s): Safe transition to appropriate next level of care at discharge, Engage patient in therapeutic group addressing interpersonal concerns.  Short Term Goals: Engage patient in aftercare planning with referrals and resources  Therapeutic Interventions: Assess for all discharge needs, 1 to 1 time with Social worker, Explore available resources and support systems, Assess for adequacy in community support network, Educate family and significant other(s) on suicide prevention, Complete Psychosocial Assessment, Interpersonal group therapy.  Evaluation of Outcomes: Adequate for Discharge   Progress in Treatment: Attending groups: Yes. Participating in groups: Yes. Taking medication as prescribed: Yes. Toleration medication:  Yes. Family/Significant other contact made: Yes, individual(s) contacted:  with the patient, refused consent for collateral contacts Patient understands diagnosis: Yes. Discussing patient identified problems/goals with staff: Yes. Medical problems stabilized or resolved: Yes. Denies suicidal/homicidal ideation: Yes. Issues/concerns per patient self-inventory: No. Other:   New problem(s) identified: None   New Short Term/Long Term Goal(s):medication stabilization, elimination of SI thoughts, development of comprehensive mental wellness plan.   Patient Goals: " While here I was able to accept my life changes and challenges that are ahead. I have  Learned better coping skills that I think will help me" Discharge Plan or Barriers:   Reason for Continuation of Hospitalization: None   Estimated Length of Stay: Discharged on Monday, 03/03/18  Attendees: Patient: Gregory Vaughan 03/03/2018 11:36 AM  Physician: Dr. Neita Garnet  03/03/2018 11:36 AM  Nursing: Mary Sella, RN 03/03/2018 11:36 AM  RN Care Manager: 03/03/2018 11:36 AM  Social Worker: Radonna Ricker, [place] 03/03/2018 11:36 AM  Recreational Therapist:  03/03/2018 11:36 AM  Other:  03/03/2018 11:36 AM  Other:  03/03/2018 11:36 AM  Other: 03/03/2018 11:36 AM    Scribe for Treatment Team: Marylee Floras, [place] 03/03/2018 11:36 AM

## 2018-03-03 NOTE — Tx Team (Addendum)
Interdisciplinary Treatment and Diagnostic Plan Update: late entry note  02/28/2018 Time of Session: [place] MRN: [id number]  Principal Diagnosis: Severe recurrent major depression without psychotic features [facility])  Secondary Diagnoses: Principal Problem:   Severe recurrent major depression without psychotic features ([name]) Active Problems:   Alcohol abuse   Current Medications:  Current Facility-Administered Medications  Medication Dose Route Frequency Provider Last Rate Last Dose  . acetaminophen (TYLENOL) tablet 650 mg  650 mg Oral Q6H PRN Rozetta Nunnery, NP      . alum & mag hydroxide-simeth (MAALOX/MYLANTA) 200-200-20 MG/5ML suspension 30 mL  30 mL Oral Q4H PRN Lindon Romp A, NP      . chlordiazePOXIDE (LIBRIUM) capsule 25 mg  25 mg Oral QID PRN Cobos, Myer Peer, MD      . citalopram (CELEXA) tablet 10 mg  10 mg Oral Daily Cobos, Myer Peer, MD   10 mg at 03/03/18 0803  . folic acid (FOLVITE) tablet 1 mg  1 mg Oral Daily Cobos, Myer Peer, MD   1 mg at 03/03/18 0803  . hydrOXYzine (ATARAX/VISTARIL) tablet 25 mg  25 mg Oral TID PRN Rozetta Nunnery, NP   25 mg at 03/02/18 2208  . loperamide (IMODIUM) capsule 2 mg  2 mg Oral PRN Cobos, Myer Peer, MD   2 mg at 03/02/18 1535  . magnesium hydroxide (MILK OF MAGNESIA) suspension 30 mL  30 mL Oral Daily PRN Lindon Romp A, NP      . nicotine (NICODERM CQ - dosed in mg/24 hours) patch 21 mg  21 mg Transdermal Daily Cobos, Myer Peer, MD   21 mg at 03/03/18 0802  . thiamine (VITAMIN B-1) tablet 100 mg  100 mg Oral Daily Cobos, Myer Peer, MD   100 mg at 03/03/18 0803  . traZODone (DESYREL) tablet 50 mg  50 mg Oral QHS PRN Rozetta Nunnery, NP   50 mg at 03/02/18 2208   PTA Medications: Medications Prior to Admission  Medication Sig Dispense Refill Last Dose  . DULoxetine (CYMBALTA) 60 MG capsule Take 60 mg by mouth daily.    02/27/2018 at Unknown time  . Vitamin D, Ergocalciferol, (DRISDOL) 50000 units CAPS capsule Take 1 capsule  (50,000 Units total) by mouth every 7 (seven) days. (Patient taking differently: Take 50,000 Units by mouth every Saturday. ) 12 capsule 0 02/28/2018 at Unknown time  . VYVANSE 40 MG capsule Take 40 mg by mouth daily.    02/27/2018 at Unknown time  . Cyanocobalamin (B-12 COMPLIANCE INJECTION) 1000 MCG/ML KIT Inject 1,000 mcg as directed every 7 (seven) days.   Unknown at Unknown time  . ibuprofen (ADVIL,MOTRIN) 200 MG tablet Take 400-800 mg by mouth every 6 (six) hours as needed (for pain or headaches).   Unknown at Unknown time  . loperamide (IMODIUM) 2 MG capsule Take 8 mg by mouth every morning.    02/27/2018 at am    Patient Stressors: Educational concerns Financial difficulties  Patient Strengths: Ability for insight Active sense of humor Average or above average intelligence  Treatment Modalities: Medication Management, Group therapy, Case management,  1 to 1 session with clinician, Psychoeducation, Recreational therapy.   Physician Treatment Plan for Primary Diagnosis: Severe recurrent major depression without psychotic features ([place]) Long Term Goal(s):     Short Term Goals:    Medication Management: Evaluate patient's response, side effects, and tolerance of medication regimen.  Therapeutic Interventions: 1 to 1 sessions, Unit Group sessions and Medication administration.  Evaluation of Outcomes:  Not Met  Physician Treatment Plan for Secondary Diagnosis: Principal Problem:   Severe recurrent major depression without psychotic features ([place]) Active Problems:   Alcohol abuse  Long Term Goal(s):     Short Term Goals:       Medication Management: Evaluate patient's response, side effects, and tolerance of medication regimen.  Therapeutic Interventions: 1 to 1 sessions, Unit Group sessions and Medication administration.  Evaluation of Outcomes: Not Met   RN Treatment Plan for Primary Diagnosis: Severe recurrent major depression without psychotic features ([place]) Long Term  Goal(s): Knowledge of disease and therapeutic regimen to maintain health will improve  Short Term Goals: Ability to identify and develop effective coping behaviors will improve and Compliance with prescribed medications will improve  Medication Management: RN will administer medications as ordered by provider, will assess and evaluate patient's response and provide education to patient for prescribed medication. RN will report any adverse and/or side effects to prescribing provider.  Therapeutic Interventions: 1 on 1 counseling sessions, Psychoeducation, Medication administration, Evaluate responses to treatment, Monitor vital signs and CBGs as ordered, Perform/monitor CIWA, COWS, AIMS and Fall Risk screenings as ordered, Perform wound care treatments as ordered.  Evaluation of Outcomes: Not Met   LCSW Treatment Plan for Primary Diagnosis: Severe recurrent major depression without psychotic features ([place]) Long Term Goal(s): Safe transition to appropriate next level of care at discharge, Engage patient in therapeutic group addressing interpersonal concerns.  Short Term Goals: Engage patient in aftercare planning with referrals and resources, Increase social support and Increase skills for wellness and recovery  Therapeutic Interventions: Assess for all discharge needs, 1 to 1 time with Social worker, Explore available resources and support systems, Assess for adequacy in community support network, Educate family and significant other(s) on suicide prevention, Complete Psychosocial Assessment, Interpersonal group therapy.  Evaluation of Outcomes: Not Met   Progress in Treatment: Attending groups: Yes. Participating in groups: Yes. Taking medication as prescribed: Yes. Toleration medication: Yes. Family/Significant other contact made: No, will contact:  when given permission Patient understands diagnosis: Yes. Discussing patient identified problems/goals with staff: Yes. Medical problems  stabilized or resolved: Yes. Denies suicidal/homicidal ideation: Yes. Issues/concerns per patient self-inventory: No. Other: none  New problem(s) identified: No, Describe:  none  New Short Term/Long Term Goal(s): Pt reports he is unsure of his short term goal.  Discharge Plan or Barriers:   Reason for Continuation of Hospitalization: Anxiety Depression Medication stabilization  Estimated Length of Stay: 3-5 days.  Attendees: Patient:Gregory Vaughan 02/28/18  Physician: Dr Parke Poisson, MD 02/28/18  Nursing: Desma Paganini, RN 02/28/18  RN Care Manager:   Social Worker: Lurline Idol, LCSW 02/28/18  Recreational Therapist:    Other:    Other:    Other:     Scribe for Treatment Team: Joanne Chars, LCSW 03/03/2018 8:35 AM

## 2018-03-03 NOTE — [facility] Suicide Risk Assessment (Signed)
[facility] Discharge Suicide Risk Assessment   Principal Problem: Severe recurrent major depression without psychotic features [facility]) Discharge Diagnoses:  Patient Active Problem List   Diagnosis Date Noted  . Alcohol abuse [F10.10] 03/01/2018  . Severe recurrent major depression without psychotic features ([facility]) [F33.2] 02/28/2018  . Other fatigue [R53.83] 12/10/2017  . Routine general medical examination at a health care facility [Z00.00] 11/30/2016  . OSA on CPAP [G47.33, Z99.89] 05/31/2016  . Generalized anxiety disorder [F41.1] 12/02/2015  . Chronic diarrhea [K52.9] 05/11/2014    Total Time spent with patient: 30 minutes  Musculoskeletal: Strength & Muscle Tone: within normal limits Gait & Station: normal Patient leans: N/A  Psychiatric Specialty Exam: ROS  No headache, no chest pain, no shortness of breath, no vomiting, no fever, no chills   Blood pressure 133/84, pulse 65, temperature 98.3 F (36.8 C), temperature source Oral, resp. rate 12, height 5\' 9"  (1.753 m), weight 73.9 kg (163 lb), SpO2 98 %.Body mass index is 24.07 kg/m.  General Appearance: improving grooming   Eye Contact::  Good  Speech:  Normal Rate409  Volume:  Normal  Mood:  states he is feeling better, more positive . Describes mood as 8/10  Affect:  Appropriate and reactive, fuller in range   Thought Process:  Linear and Descriptions of Associations: Intact  Orientation:  Full (Time, Place, and Person)  Thought Content:  no hallucinations, no delusions, not internally preoccupied   Suicidal Thoughts:  No denies suicidal ideations, denies any self injurious ideations   Homicidal Thoughts:  No denies any homicidal or violent ideations , specifically also denies any homicidal or violent ideations towards his wife   Memory:  recent and remote grossly intact   Judgement:  Other:  improving   Insight:  improving  Psychomotor Activity:  Normal  Concentration:  Good  Recall:  Good  Fund of Knowledge:Good   Language: Good  Akathisia:  Negative  Handed:  Right  AIMS (if indicated):     Assets:  Communication Skills Desire for Improvement Resilience  Sleep:  Number of Hours: 5.75  Cognition: WNL  ADL's:  Intact   Mental Status Per Nursing Assessment::   On Admission:  Suicidal ideation indicated by patient  Demographic Factors:  47 year old married male, has two children,  employed   Loss Factors: Mother's death May 27, 2008, marital difficulties   Historical Factors: Reports history of ETOH Use Disorder , no prior psychiatric admissions, no history of suicide attempts, no history of violence .   Risk Reduction Factors:   Responsible for children under 7 years of age, Sense of responsibility to family, Employed and Positive coping skills or problem solving skills  Continued Clinical Symptoms:  At this time patient is alert, attentive , well related, mood improved, presents euthymic, with fuller range of affect, no thought disorder, no suicidal or self injurious ideations, no homicidal or violent ideations,no hallucinations, no delusions, future oriented. Behavior on unit in good control, pleasant on approach. Denies medication side effects. No symptoms of ETOH withdrawal .  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 States he is interested in going to AA and states he has made an appointment to see a counselor who focuses on sex addiction management. We have discussed medication options for alcohol use disorder such as Acamprosate and Naltrexone, patient states he will discuss further with his outpatient provider.    Cognitive Features That Contribute To Risk:  No gross cognitive  deficits noted upon discharge. Is alert , attentive, and oriented x 3   Suicide Risk:  Mild:  Suicidal ideation of limited frequency, intensity, duration, and specificity.  There are no identifiable plans, no associated intent, mild dysphoria and  related symptoms, good self-control (both objective and subjective assessment), few other risk factors, and identifiable protective factors, including available and accessible social support.    Plan Of Care/Follow-up recommendations:  Activity:  as tolerated  Diet:  regular Tests:  NA Other:  See below  Patient is expressing readiness for discharge and there are no current grounds for involuntary commitment  He is leaving unit in good spirits  Plans to return home Reports he plans to attend AA meetings and as above, reports he has made appointment to see a counselor to address S.A. Issues . He has an established outpatient Mental Health Provider Corie Chiquito( [facility] . He also has an established PCP, [facility] Hillard DankerElizabeth Crawford , [facility].  Craige CottaFernando A Breeanne Oblinger, MD 03/03/2018, 8:12 AM

## 2018-03-07 ENCOUNTER — Encounter: Payer: Self-pay | Admitting: Internal Medicine

## 2018-03-07 ENCOUNTER — Ambulatory Visit (INDEPENDENT_AMBULATORY_CARE_PROVIDER_SITE_OTHER): Payer: 59 | Admitting: Internal Medicine

## 2018-03-07 VITALS — BP 110/80 | HR 74 | Temp 98.7°F | Ht 69.0 in | Wt 164.0 lb

## 2018-03-07 DIAGNOSIS — M109 Gout, unspecified: Secondary | ICD-10-CM | POA: Insufficient documentation

## 2018-03-07 MED ORDER — PREDNISONE 20 MG PO TABS
40.0000 mg | ORAL_TABLET | Freq: Every day | ORAL | 0 refills | Status: AC
Start: 2018-03-07 — End: ?

## 2018-03-07 NOTE — Assessment & Plan Note (Signed)
Rx for prednisone for the flare and will continue advil prn. Labs placed for uric acid to get done in about 1 month.

## 2018-03-07 NOTE — Progress Notes (Signed)
   Subjective:    Patient ID: Gregory AlbertsStephen Ucci, male    DOB: March 09, 1971, 47 y.o.   MRN: [id number]  HPI The patient is a 47 YO man coming in for left foot pain in the great toe. Started about 4 days ago. Very severe initially. He is taking advil around the clock and this is helping some. It is red and swollen. He denies injury or overuse. No fevers or chills.   Review of Systems  Constitutional: Positive for activity change. Negative for appetite change, fatigue, fever and unexpected weight change.  Respiratory: Negative.   Cardiovascular: Negative.   Musculoskeletal: Positive for arthralgias, joint swelling and myalgias. Negative for back pain.  Skin: Negative.   Neurological: Negative for syncope, weakness and numbness.      Objective:   Physical Exam  Constitutional: He is oriented to person, place, and time. He appears well-developed and well-nourished.  HENT:  Head: Normocephalic and atraumatic.  Eyes: EOM are normal.  Neck: Normal range of motion.  Cardiovascular: Normal rate and regular rhythm.  Pulmonary/Chest: Effort normal and breath sounds normal. No respiratory distress. He has no wheezes. He has no rales.  Abdominal: Soft.  Musculoskeletal: He exhibits edema and tenderness.  Left great toe mtp joint with redness and swelling and very tender  Neurological: He is alert and oriented to person, place, and time. Coordination normal.  Skin: Skin is warm and dry.   Vitals:   03/07/18 1033  BP: 110/80  Pulse: 74  Temp: 98.7 F (37.1 C)  TempSrc: Oral  SpO2: 98%  Weight: 164 lb (74.4 kg)  Height: 5\' 9"  (1.753 m)      Assessment & Plan:

## 2018-03-07 NOTE — Patient Instructions (Addendum)
We have sent in the prednisone. Take 2 pills daily for 5 days.   Keep taking the advil as well for pain. You can use ice also.   Come back in about 1 month to do some labs to check for your risk of gout.   Gout Gout is painful swelling that can happen in some of your joints. Gout is a type of arthritis. This condition is caused by having too much uric acid in your body. Uric acid is a chemical that is made when your body breaks down substances called purines. If your body has too much uric acid, sharp crystals can form and build up in your joints. This causes pain and swelling. Gout attacks can happen quickly and be very painful (acute gout). Over time, the attacks can affect more joints and happen more often (chronic gout). Follow these instructions at home: During a Gout Attack  If directed, put ice on the painful area: ? Put ice in a plastic bag. ? Place a towel between your skin and the bag. ? Leave the ice on for 20 minutes, 2-3 times a day.  Rest the joint as much as possible. If the joint is in your leg, you may be given crutches to use.  Raise (elevate) the painful joint above the level of your heart as often as you can.  Drink enough fluids to keep your pee (urine) clear or pale yellow.  Take over-the-counter and prescription medicines only as told by your doctor.  Do not drive or use heavy machinery while taking prescription pain medicine.  Follow instructions from your doctor about what you can or cannot eat and drink.  Return to your normal activities as told by your doctor. Ask your doctor what activities are safe for you. Avoiding Future Gout Attacks  Follow a low-purine diet as told by a specialist (dietitian) or your doctor. Avoid foods and drinks that have a lot of purines, such as: ? Liver. ? Kidney. ? Anchovies. ? Asparagus. ? Herring. ? Mushrooms ? Mussels. ? Beer.  Limit alcohol intake to no more than 1 drink a day for nonpregnant women and 2 drinks a  day for men. One drink equals 12 oz of beer, 5 oz of wine, or 1 oz of hard liquor.  Stay at a healthy weight or lose weight if you are overweight. If you want to lose weight, talk with your doctor. It is important that you do not lose weight too fast.  Start or continue an exercise plan as told by your doctor.  Drink enough fluids to keep your pee clear or pale yellow.  Take over-the-counter and prescription medicines only as told by your doctor.  Keep all follow-up visits as told by your doctor. This is important. Contact a doctor if:  You have another gout attack.  You still have symptoms of a gout attack after10 days of treatment.  You have problems (side effects) because of your medicines.  You have chills or a fever.  You have burning pain when you pee (urinate).  You have pain in your lower back or belly. Get help right away if:  You have very bad pain.  Your pain cannot be controlled.  You cannot pee. This information is not intended to replace advice given to you by your health care provider. Make sure you discuss any questions you have with your health care provider. Document Released: 09/18/2008 Document Revised: 05/17/2016 Document Reviewed: 09/22/2015 Elsevier Interactive Patient Education  [employer].  Low-Purine Diet Purines are compounds that affect the level of uric acid in your body. A low-purine diet is a diet that is low in purines. Eating a low-purine diet can prevent the level of uric acid in your body from getting too high and causing gout or kidney stones or both. What do I need to know about this diet?  Choose low-purine foods. Examples of low-purine foods are listed in the next section.  Drink plenty of fluids, especially water. Fluids can help remove uric acid from your body. Try to drink 8-16 cups (1.9-3.8 L) a day.  Limit foods high in fat, especially saturated fat, as fat makes it harder for the body to get rid of uric acid. Foods high  in saturated fat include pizza, cheese, ice cream, whole milk, fried foods, and gravies. Choose foods that are lower in fat and lean sources of protein. Use olive oil when cooking as it contains healthy fats that are not high in saturated fat.  Limit alcohol. Alcohol interferes with the elimination of uric acid from your body. If you are having a gout attack, avoid all alcohol.  Keep in mind that different people's bodies react differently to different foods. You will probably learn over time which foods do or do not affect you. If you discover that a food tends to cause your gout to flare up, avoid eating that food. You can more freely enjoy foods that do not cause problems. If you have any questions about a food item, talk to your dietitian or health care provider. Which foods are low, moderate, and high in purines? The following is a list of foods that are low, moderate, and high in purines. You can eat any amount of the foods that are low in purines. You may be able to have small amounts of foods that are moderate in purines. Ask your health care provider how much of a food moderate in purines you can have. Avoid foods high in purines. Grains  Foods low in purines: Enriched white bread, pasta, rice, cake, cornbread, popcorn.  Foods moderate in purines: Whole-grain breads and cereals, wheat germ, bran, oatmeal. Uncooked oatmeal. Dry wheat bran or wheat germ.  Foods high in purines: Pancakes, [place] toast, biscuits, muffins. Vegetables  Foods low in purines: All vegetables, except those that are moderate in purines.  Foods moderate in purines: Asparagus, cauliflower, spinach, mushrooms, green peas. Fruits  All fruits are low in purines. Meats and other Protein Foods  Foods low in purines: Eggs, nuts, peanut butter.  Foods moderate in purines: 80-90% lean beef, lamb, veal, pork, poultry, fish, eggs, peanut butter, nuts. Crab, lobster, oysters, and shrimp. Cooked dried beans, peas, and  lentils.  Foods high in purines: Anchovies, sardines, herring, mussels, tuna, codfish, scallops, trout, and haddock. Tomasa Blase. Organ meats (such as liver or kidney). Tripe. Game meat. Goose. Sweetbreads. Dairy  All dairy foods are low in purines. Low-fat and fat-free dairy products are best because they are low in saturated fat. Beverages  Drinks low in purines: Water, carbonated beverages, tea, coffee, cocoa.  Drinks moderate in purines: Soft drinks and other drinks sweetened with high-fructose corn syrup. Juices. To find whether a food or drink is sweetened with high-fructose corn syrup, look at the ingredients list.  Drinks high in purines: Alcoholic beverages (such as beer). Condiments  Foods low in purines: Salt, herbs, olives, pickles, relishes, vinegar.  Foods moderate in purines: Butter, margarine, oils, mayonnaise. Fats and Oils  Foods low in purines: All types, except  gravies and sauces made with meat.  Foods high in purines: Gravies and sauces made with meat. Other Foods  Foods low in purines: Sugars, sweets, gelatin. Cake. Soups made without meat.  Foods moderate in purines: Meat-based or fish-based soups, broths, or bouillons. Foods and drinks sweetened with high-fructose corn syrup.  Foods high in purines: High-fat desserts (such as ice cream, cookies, cakes, pies, doughnuts, and chocolate). Contact your dietitian for more information on foods that are not listed here. This information is not intended to replace advice given to you by your health care provider. Make sure you discuss any questions you have with your health care provider. Document Released: 04/06/2011 Document Revised: 05/17/2016 Document Reviewed: 11/16/2013 Elsevier Interactive Patient Education  2017 [employer]

## 2018-04-04 ENCOUNTER — Telehealth: Payer: Self-pay | Admitting: Internal Medicine

## 2018-04-04 MED ORDER — CITALOPRAM HYDROBROMIDE 10 MG PO TABS: 10.0000 mg | ORAL_TABLET | Freq: Every day | ORAL | 5 refills | Status: AC

## 2018-04-04 NOTE — Telephone Encounter (Signed)
Reviewed chart pt is up-to-date sent refills to [facility].../lmb  

## 2018-04-04 NOTE — Telephone Encounter (Signed)
Pt would like a refill of his citalopram (CELEXA) 10 MG tablet  Please send to [facility]

## 2018-09-13 IMAGING — DX DG HAND COMPLETE 3+V*L*
3 series · 4 of 4 positions shown · non-contrast
Comparison: None.

CLINICAL DATA: Pain to 2nd mcp joint s/p puncture wound today.
Relates that a carving knife slipped while he was sculpting [place]
today.

EXAM:
LEFT HAND - COMPLETE 3+ VIEW

[hand ap]
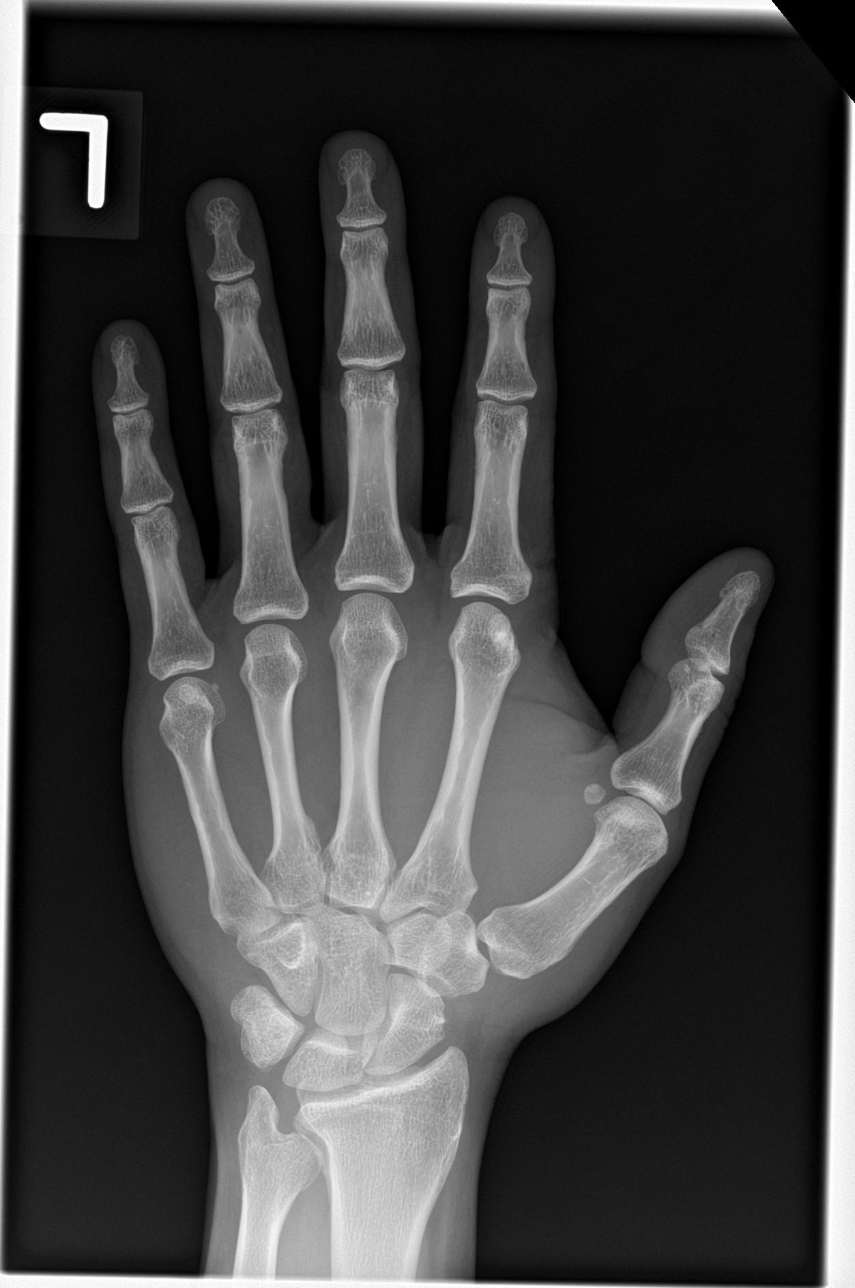

[Series 2: hand obl · 0.14mm/px · 2 of 2 slices shown]
[im 1/2]
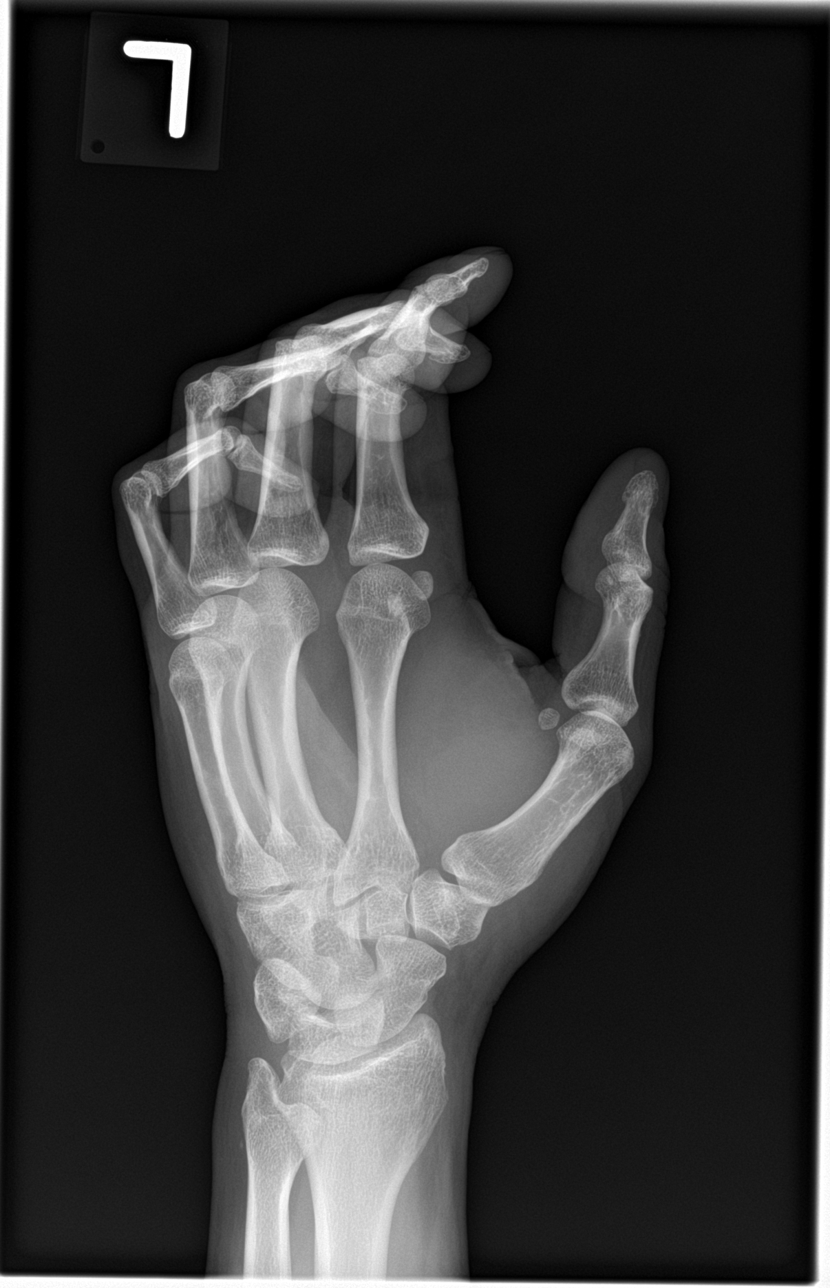
[im 2/2]
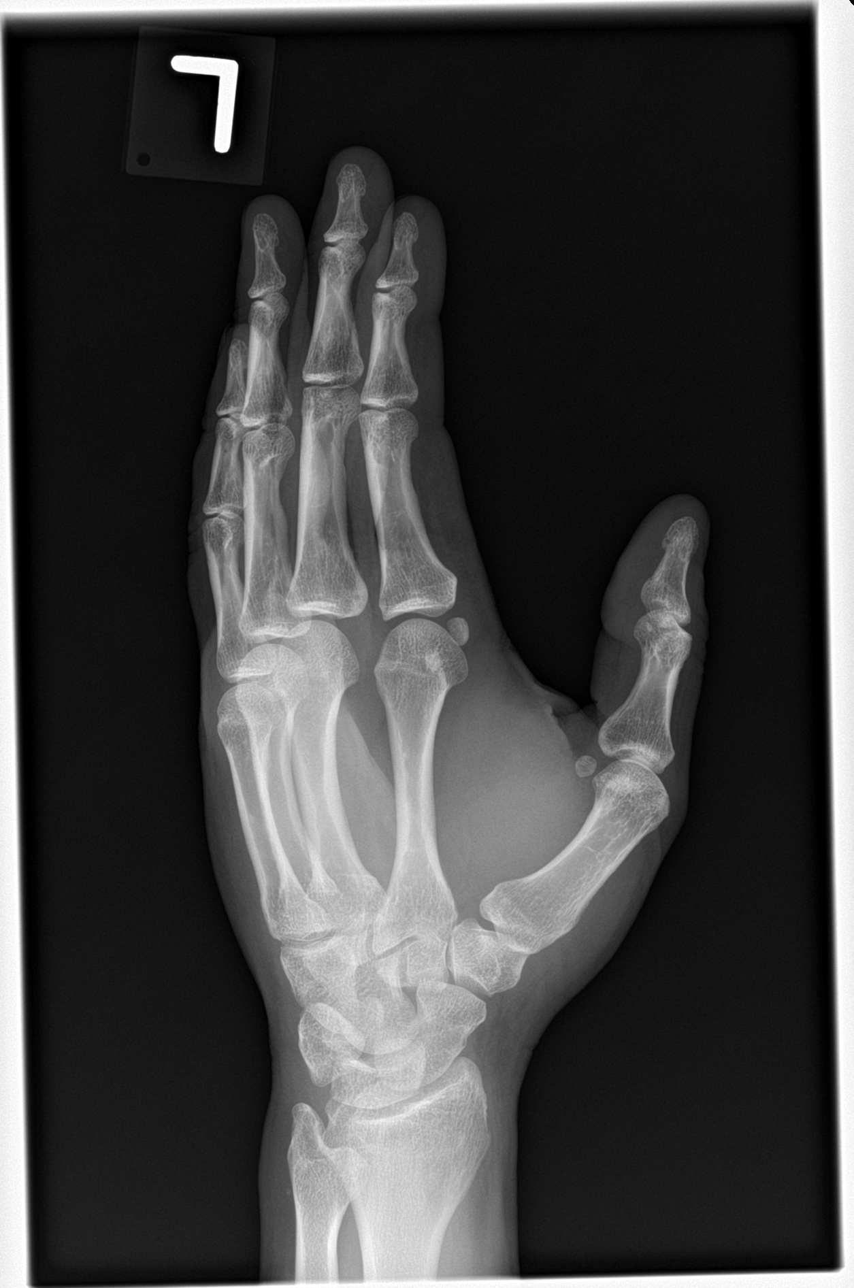

[hand lat]
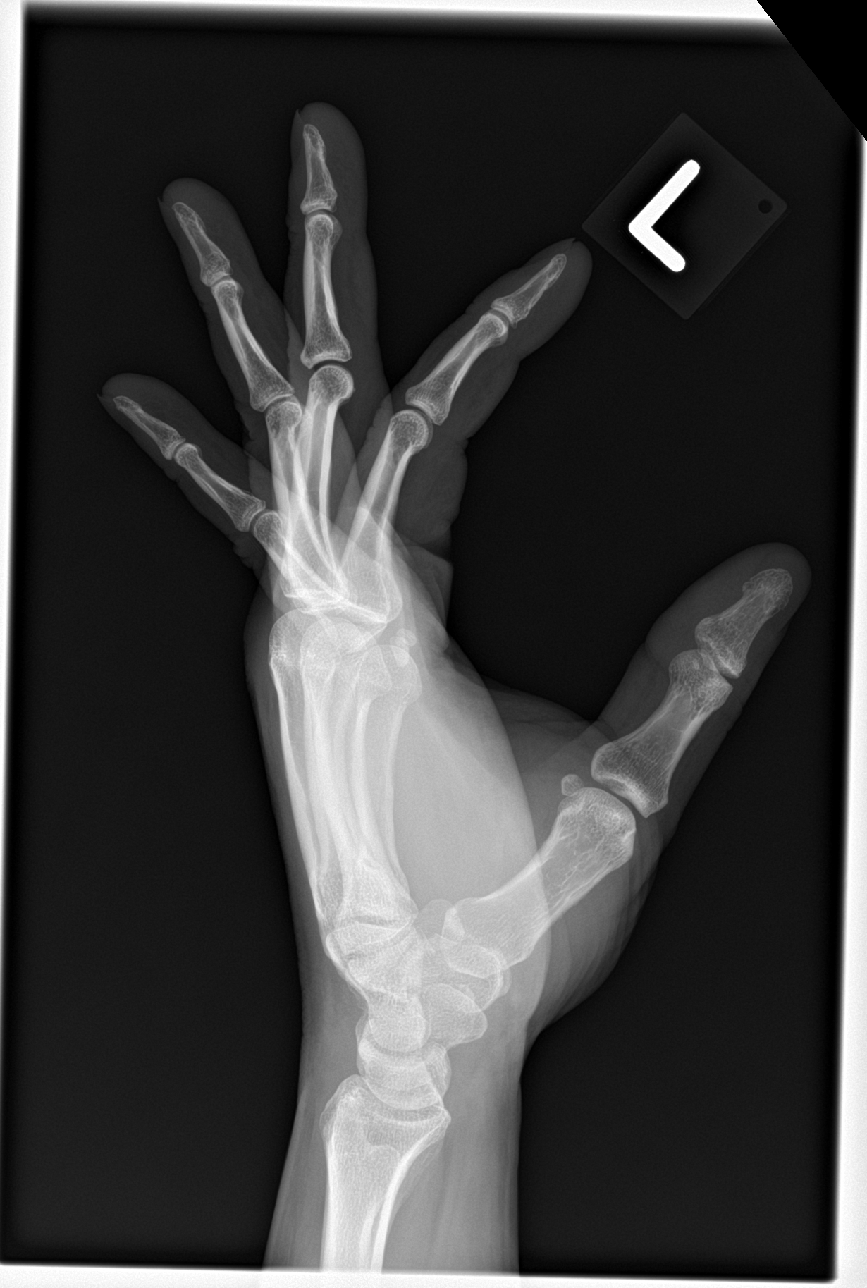

[4 of 4 positions shown; findings below may reference images not displayed]

FINDINGS: There is no evidence of fracture or dislocation. There is no
evidence of arthropathy or other focal bone abnormality. There is
ulnar minus variance. Soft tissues are unremarkable.
IMPRESSION: No acute osseous injury of the left hand.
